# Patient Record
Sex: Female | Born: 1965
Health system: Southern US, Community
[De-identification: ages and names within clinical notes are randomized; demographics above are authoritative.]

## PROBLEM LIST (undated history)

## (undated) DIAGNOSIS — F329 Major depressive disorder, single episode, unspecified: Secondary | ICD-10-CM

## (undated) DIAGNOSIS — F32A Depression, unspecified: Secondary | ICD-10-CM

## (undated) HISTORY — DX: Major depressive disorder, single episode, unspecified: F32.9

## (undated) HISTORY — PX: NO PAST SURGERIES: SHX2092

## (undated) HISTORY — DX: Depression, unspecified: F32.A

---

## 2010-01-11 ENCOUNTER — Ambulatory Visit: Payer: Self-pay

## 2010-01-17 ENCOUNTER — Ambulatory Visit: Payer: Self-pay

## 2012-12-16 ENCOUNTER — Ambulatory Visit: Payer: Self-pay

## 2012-12-31 ENCOUNTER — Encounter: Payer: Self-pay | Admitting: Internal Medicine

## 2012-12-31 ENCOUNTER — Ambulatory Visit (INDEPENDENT_AMBULATORY_CARE_PROVIDER_SITE_OTHER): Payer: BC Managed Care – PPO | Admitting: Internal Medicine

## 2012-12-31 VITALS — BP 110/60 | HR 62 | Temp 97.6°F | Ht 62.0 in | Wt 143.0 lb

## 2012-12-31 DIAGNOSIS — F329 Major depressive disorder, single episode, unspecified: Secondary | ICD-10-CM

## 2012-12-31 DIAGNOSIS — F3289 Other specified depressive episodes: Secondary | ICD-10-CM

## 2012-12-31 DIAGNOSIS — F32A Depression, unspecified: Secondary | ICD-10-CM

## 2012-12-31 DIAGNOSIS — Z13220 Encounter for screening for lipoid disorders: Secondary | ICD-10-CM

## 2012-12-31 DIAGNOSIS — N939 Abnormal uterine and vaginal bleeding, unspecified: Secondary | ICD-10-CM

## 2012-12-31 DIAGNOSIS — Z780 Asymptomatic menopausal state: Secondary | ICD-10-CM

## 2012-12-31 DIAGNOSIS — N926 Irregular menstruation, unspecified: Secondary | ICD-10-CM

## 2012-12-31 MED ORDER — HYDROCORTISONE ACE-PRAMOXINE 1-1 % RE CREA: TOPICAL_CREAM | Freq: Two times a day (BID) | RECTAL | Status: DC

## 2012-12-31 NOTE — Patient Instructions (Signed)
I want you to start zantac (ranitidine) 150mg  - one per day.  Take 30 minutes before you eat breakfast.

## 2013-01-01 ENCOUNTER — Other Ambulatory Visit (INDEPENDENT_AMBULATORY_CARE_PROVIDER_SITE_OTHER): Payer: BC Managed Care – PPO

## 2013-01-01 DIAGNOSIS — Z13220 Encounter for screening for lipoid disorders: Secondary | ICD-10-CM

## 2013-01-01 DIAGNOSIS — N926 Irregular menstruation, unspecified: Secondary | ICD-10-CM

## 2013-01-01 LAB — CBC WITH DIFFERENTIAL/PLATELET
Basophils Absolute: 0 10*3/uL (ref 0.0–0.1)
Eosinophils Absolute: 0.1 10*3/uL (ref 0.0–0.7)
HCT: 40.8 % (ref 36.0–46.0)
Hemoglobin: 13.7 g/dL (ref 12.0–15.0)
Lymphocytes Relative: 39.9 % (ref 12.0–46.0)
Lymphs Abs: 2.2 10*3/uL (ref 0.7–4.0)
MCHC: 33.5 g/dL (ref 30.0–36.0)
Neutro Abs: 2.8 10*3/uL (ref 1.4–7.7)
Platelets: 260 10*3/uL (ref 150.0–400.0)
RDW: 13 % (ref 11.5–14.6)

## 2013-01-01 LAB — LIPID PANEL
HDL: 63.2 mg/dL (ref >39.00)
Total CHOL/HDL Ratio: 4
Triglycerides: 69 mg/dL (ref 0.0–149.0)
VLDL: 13.8 mg/dL (ref 0.0–40.0)

## 2013-01-01 LAB — COMPREHENSIVE METABOLIC PANEL
ALT: 15 U/L (ref 0–35)
AST: 16 U/L (ref 0–37)
Creatinine, Ser: 1 mg/dL (ref 0.4–1.2)
Sodium: 138 mEq/L (ref 135–145)
Total Bilirubin: 0.7 mg/dL (ref 0.3–1.2)

## 2013-01-03 ENCOUNTER — Encounter: Payer: Self-pay | Admitting: Internal Medicine

## 2013-01-03 DIAGNOSIS — Z780 Asymptomatic menopausal state: Secondary | ICD-10-CM | POA: Insufficient documentation

## 2013-01-03 DIAGNOSIS — F329 Major depressive disorder, single episode, unspecified: Secondary | ICD-10-CM | POA: Insufficient documentation

## 2013-01-03 NOTE — Assessment & Plan Note (Signed)
No period for years now.  She request [facility] to be checked.

## 2013-01-03 NOTE — Assessment & Plan Note (Signed)
On Zoloft and doing well.  Follow.  Discussed the possibility of a slow taper in the near future.

## 2013-01-03 NOTE — Progress Notes (Signed)
Subjective:    Patient ID: Alexandra Delgado, female    DOB: [birth year], 47 y.o.   MRN: [id number]  HPI 47 year old female with past history of depression who comes in today to follow up on this as well as to establish care.  She has previously been seeing Dr Vear Clock.  States her last pap smear was 10 years ago.  Has not had regular physicals.  She has a history of one abnormal pap smear.  Repeat ok.  She is up to date with her mammograms.  States last mammo was recent and ok.  She dose have a history of depression.  Was situational - when she was going through her divorce.  On Zoloft.  Has tried to come off previously - but made her feel sick on her stomach when she tried to stop.  Has been doing well on the medication.  She has not had a period in a few years.  Prior to that, had sporadic periods.  No bleeding or spotting for a few years.  No chest pain or tightness.  No sob.  She does occasionally feel as if things are getting "hung up" in her throat.  Question of some minimal acid reflux. Able to drink without difficulty.  Bowels stable.    Past Medical History  Diagnosis Date  . Depression     Outpatient Encounter Prescriptions as of 12/31/2012  Medication Sig Dispense Refill  . pramoxine-hydrocortisone (ANALPRAM-HC) 1-1 % rectal cream Place rectally 2 (two) times daily.  30 g  0  . sertraline (ZOLOFT) 100 MG tablet Take 100 mg by mouth daily.       No facility-administered encounter medications on file as of 12/31/2012.    Review of Systems Patient denies any headache, lightheadedness or dizziness.  No sinus or allergy symptoms.  No chest pain, tightness or palpitations.  No increased shortness of breath, cough or congestion.  Minimal acid reflux as outlined.  Minimal dysphagia.  No odynophagia.  No nausea or vomiting.  No abdominal pain or cramping.  No bowel change, such as diarrhea, constipation, BRBPR or melana.  No urine change.  No vaginal itching or irritation.  No period or spotting  for years.  She does have what she feels is a hemorrhoid - external.  First noticed 8 months ago.  Occasional bleeding.  Regular bowel movements.  No pain.  No change during these 8 months.  Feels she is handling stress well.  On zoloft and doing well.  No depression.      Objective:   Physical Exam Filed Vitals:   12/31/12 0947  BP: 110/60  Pulse: 62  Temp: 97.6 F (55.55 C)   47 year old female in no acute distress.   HEENT:  Nares- clear.  Oropharynx - without lesions. NECK:  Supple.  Nontender.  No audible bruit.  HEART:  Appears to be regular. LUNGS:  No crackles or wheezing audible.  Respirations even and unlabored.  RADIAL PULSE:  Equal bilaterally.    ABDOMEN:  Soft, nontender.  Bowel sounds present and normal.  No audible abdominal bruit.    RECTAL:  Heme negative.  External rectal tag.  No pain.  No increased erythema.    EXTREMITIES:  No increased edema present.  DP pulses palpable and equal bilaterally.      SKIN:  No rash.  NEURO:  No focal neurological deficits noted on exam.      Assessment & Plan:  QUESTION OF HEMORRHOID.  Exam as outlined.  Analpram as directed.  Follow.    FAMILY HISTORY OF BREAST CANCER.  States she is up to date with her mammograms.  Obtain results.   HISTORY OF ABNORMAL PAP.  Overdue a pap.  Will schedule to be done next visit.   HEALTH MAINTENANCE.  Overdue a physical.  Schedule a physical next visit.  Obtain results of mammogram.

## 2013-01-04 LAB — FOLLICLE STIMULATING HORMONE: FSH: 97 m[IU]/mL

## 2013-01-05 ENCOUNTER — Encounter: Payer: Self-pay | Admitting: General Practice

## 2013-01-05 NOTE — Progress Notes (Signed)
Letter mailed to pt.  

## 2013-02-15 ENCOUNTER — Other Ambulatory Visit (HOSPITAL_COMMUNITY)
Admission: RE | Admit: 2013-02-15 | Discharge: 2013-02-15 | Disposition: A | Discharge status: Home / Self Care | Payer: BC Managed Care – PPO | Source: Ambulatory Visit | Attending: Internal Medicine | Admitting: Internal Medicine

## 2013-02-15 ENCOUNTER — Ambulatory Visit (INDEPENDENT_AMBULATORY_CARE_PROVIDER_SITE_OTHER): Payer: BC Managed Care – PPO | Admitting: Internal Medicine

## 2013-02-15 ENCOUNTER — Encounter: Payer: Self-pay | Admitting: Internal Medicine

## 2013-02-15 VITALS — BP 120/70 | HR 73 | Temp 97.9°F | Ht 61.0 in | Wt 145.2 lb

## 2013-02-15 DIAGNOSIS — M549 Dorsalgia, unspecified: Secondary | ICD-10-CM

## 2013-02-15 DIAGNOSIS — Z780 Asymptomatic menopausal state: Secondary | ICD-10-CM

## 2013-02-15 DIAGNOSIS — Z124 Encounter for screening for malignant neoplasm of cervix: Secondary | ICD-10-CM

## 2013-02-15 DIAGNOSIS — Z01419 Encounter for gynecological examination (general) (routine) without abnormal findings: Secondary | ICD-10-CM | POA: Insufficient documentation

## 2013-02-15 DIAGNOSIS — F3289 Other specified depressive episodes: Secondary | ICD-10-CM

## 2013-02-15 DIAGNOSIS — Z1151 Encounter for screening for human papillomavirus (HPV): Secondary | ICD-10-CM | POA: Insufficient documentation

## 2013-02-15 DIAGNOSIS — F329 Major depressive disorder, single episode, unspecified: Secondary | ICD-10-CM

## 2013-02-15 DIAGNOSIS — F32A Depression, unspecified: Secondary | ICD-10-CM

## 2013-02-15 LAB — URINALYSIS, ROUTINE W REFLEX MICROSCOPIC
Hgb urine dipstick: NEGATIVE
Specific Gravity, Urine: >=1.03 (ref 1.000–1.030)
Urine Glucose: NEGATIVE
Urobilinogen, UA: 0.2 (ref 0.0–1.0)

## 2013-02-15 LAB — BASIC METABOLIC PANEL
Calcium: 9.4 mg/dL (ref 8.4–10.5)
GFR: 57.52 mL/min — ABNORMAL LOW (ref >60.00)
Potassium: 4.9 mEq/L (ref 3.5–5.1)
Sodium: 139 mEq/L (ref 135–145)

## 2013-02-16 ENCOUNTER — Ambulatory Visit: Payer: BC Managed Care – PPO

## 2013-02-16 DIAGNOSIS — M549 Dorsalgia, unspecified: Secondary | ICD-10-CM

## 2013-02-21 ENCOUNTER — Encounter: Payer: Self-pay | Admitting: Internal Medicine

## 2013-02-21 LAB — URINE CULTURE: Colony Count: 45000

## 2013-02-21 NOTE — Assessment & Plan Note (Signed)
On Zoloft and doing well.  Follow.  Discussed the possibility of a slow taper in the near future.   

## 2013-02-21 NOTE — Assessment & Plan Note (Signed)
No period for years now.  Follow.  Appears to be doing well.   

## 2013-02-21 NOTE — Progress Notes (Signed)
Subjective:    Patient ID: Alexandra Delgado, female    DOB: Mar 27, 1966, 46 y.o.   MRN: [id number]  HPI 47 year old female with past history of depression who comes in today to follow up on this as well as for a complete physical exam.  She has a history of one abnormal pap smear.  Repeat ok.  She is up to date with her mammograms.  States last mammo was recent and ok.  She dose have a history of depression.  Was situational - when she was going through her divorce.  On Zoloft.  Has tried to come off previously - but made her feel sick on her stomach when she tried to stop.  Has been doing well on the medication.  She has not had a period in a few years.  Prior to that, had sporadic periods.  No bleeding or spotting for a few years.  No chest pain or tightness.  No sob.  Bowels stable.  She does report some right lower back discomfort.  Began a few days ago - dull ache.  No known injury or trauma.  Tylenol helped.  Does not appear to worsen with certain movements.  No rash.  She is anxious about her kidneys.     Past Medical History  Diagnosis Date  . Depression     Outpatient Encounter Prescriptions as of 02/15/2013  Medication Sig Dispense Refill  . sertraline (ZOLOFT) 100 MG tablet Take 100 mg by mouth daily.      . pramoxine-hydrocortisone (ANALPRAM-HC) 1-1 % rectal cream Place rectally 2 (two) times daily.  30 g  0   No facility-administered encounter medications on file as of 02/15/2013.    Review of Systems Patient denies any headache, lightheadedness or dizziness.  No sinus or allergy symptoms.  No chest pain, tightness or palpitations.  No increased shortness of breath, cough or congestion.  No acid reflux reported today.  No nausea or vomiting.  No abdominal pain or cramping.  No bowel change, such as diarrhea, constipation, BRBPR or melana.  No urine change.  No vaginal itching or irritation.  No period or spotting for years.  Feels she is handling stress well.  On zoloft and doing well.   No depression.  Some back discomfort as outlined.       Objective:   Physical Exam  Filed Vitals:   02/15/13 1127  BP: 120/70  Pulse: 73  Temp: 97.9 F (2.67 C)   47 year old female in no acute distress.   HEENT:  Nares- clear.  Oropharynx - without lesions. NECK:  Supple.  Nontender.  No audible bruit.  HEART:  Appears to be regular. LUNGS:  No crackles or wheezing audible.  Respirations even and unlabored.  RADIAL PULSE:  Equal bilaterally.    BREASTS:  No nipple discharge or nipple retraction present.  Could not appreciate any distinct nodules or axillary adenopathy.  ABDOMEN:  Soft, nontender.  Bowel sounds present and normal.  No audible abdominal bruit.  GU:  Normal external genitalia.  Vaginal vault without lesions.  Cervix identified.  Pap performed. Could not appreciate any adnexal masses or tenderness.   RECTAL:  Heme negative.   BACK:  Non tender to palpation.  No CVA tenderness.  EXTREMITIES:  No increased edema present.  DP pulses palpable and equal bilaterally.          Assessment & Plan:  FAMILY HISTORY OF BREAST CANCER.  States she is up to date with her mammograms.  Need results.   BACK PAIN.  Tylenol appears to help.  Tylenol prn.  Check urine and renal function to confirm no change.  Follow.  Notify me if symptoms persist or worsen.    HISTORY OF ABNORMAL PAP.  Pelvic and pap today.    HEALTH MAINTENANCE.  Physical today.  Pap today.  Need results of mammogram.

## 2013-02-22 ENCOUNTER — Other Ambulatory Visit: Payer: Self-pay | Admitting: Internal Medicine

## 2013-02-22 DIAGNOSIS — N390 Urinary tract infection, site not specified: Secondary | ICD-10-CM

## 2013-02-22 NOTE — Progress Notes (Signed)
Order placed for f/u labs.  

## 2013-02-23 ENCOUNTER — Telehealth: Payer: Self-pay | Admitting: *Deleted

## 2013-02-23 DIAGNOSIS — N390 Urinary tract infection, site not specified: Secondary | ICD-10-CM

## 2013-02-23 MED ORDER — CIPROFLOXACIN HCL 500 MG PO TABS: 500.0000 mg | ORAL_TABLET | Freq: Two times a day (BID) | ORAL | Status: DC

## 2013-02-23 NOTE — Telephone Encounter (Signed)
Message copied by Theola Sequin on Tue Feb 23, 2013  8:50 AM ------      Message from: Charm Barges      Created: Mon Feb 22, 2013  3:34 PM       I have tried to call pt multiple times regarding her lab.  Unable to reach.  Did not leave message over weekend - secondary to her not being able to call back.  Please call and notify her that her urine culture was positive for urinary tract infection.  Confirm that she is not allergic to cipro.  If not, then treat with cipro 500mg  bid x 5 days.  Will need to recheck a urinalysis 2 weeks after she completes her abx.  Also her kidney function is relatively stable.  I do want her to stay hydrated.  Will treat the urinary infection and will plan to repeat her kidney function in 2 weeks - with the repeat urinalysis.  Please schedule her for a non fasting lab appt in 2 -3 weeks.  Also, let her know that her pap smear - ok. ------

## 2013-03-10 ENCOUNTER — Other Ambulatory Visit: Payer: BC Managed Care – PPO

## 2013-03-11 ENCOUNTER — Other Ambulatory Visit: Payer: BC Managed Care – PPO

## 2013-03-11 ENCOUNTER — Emergency Department: Payer: Self-pay | Admitting: Emergency Medicine

## 2013-03-11 LAB — CBC WITH DIFFERENTIAL/PLATELET
Basophil %: 0.8 %
HCT: 42.2 % (ref 35.0–47.0)
Lymphocyte #: 3.9 10*3/uL — ABNORMAL HIGH (ref 1.0–3.6)
Lymphocyte %: 52.2 %
MCH: 29.2 pg (ref 26.0–34.0)
MCHC: 34.1 g/dL (ref 32.0–36.0)
MCV: 86 fL (ref 80–100)
Monocyte #: 0.5 x10 3/mm (ref 0.2–0.9)
Neutrophil #: 2.9 10*3/uL (ref 1.4–6.5)
Platelet: 286 10*3/uL (ref 150–440)
RDW: 12.6 % (ref 11.5–14.5)
WBC: 7.5 10*3/uL (ref 3.6–11.0)

## 2013-03-11 LAB — BASIC METABOLIC PANEL
Anion Gap: 9 (ref 7–16)
BUN: 13 mg/dL (ref 7–18)
Calcium, Total: 9.6 mg/dL (ref 8.5–10.1)
Chloride: 103 mmol/L (ref 98–107)
Co2: 25 mmol/L (ref 21–32)
Creatinine: 0.98 mg/dL (ref 0.60–1.30)
EGFR (African American): >60
EGFR (Non-African Amer.): >60
Osmolality: 276 (ref 275–301)
Potassium: 3.6 mmol/L (ref 3.5–5.1)
Sodium: 137 mmol/L (ref 136–145)

## 2013-03-11 LAB — URINALYSIS, COMPLETE
Bilirubin,UR: NEGATIVE
Glucose,UR: NEGATIVE mg/dL (ref 0–75)
Ketone: NEGATIVE
Leukocyte Esterase: NEGATIVE
Protein: NEGATIVE
RBC,UR: 1 /HPF (ref 0–5)
WBC UR: 2 /HPF (ref 0–5)

## 2013-04-01 ENCOUNTER — Other Ambulatory Visit: Payer: Self-pay | Admitting: Internal Medicine

## 2013-05-06 ENCOUNTER — Encounter: Payer: Self-pay | Admitting: Internal Medicine

## 2013-07-08 ENCOUNTER — Telehealth: Payer: Self-pay | Admitting: Internal Medicine

## 2013-07-08 MED ORDER — SERTRALINE HCL 100 MG PO TABS: ORAL_TABLET | ORAL | Status: DC

## 2013-07-08 NOTE — Telephone Encounter (Signed)
Rx sent 

## 2013-07-08 NOTE — Telephone Encounter (Signed)
Sertraline refill needed.  [facility] [address].

## 2013-07-14 ENCOUNTER — Ambulatory Visit (INDEPENDENT_AMBULATORY_CARE_PROVIDER_SITE_OTHER): Payer: BC Managed Care – PPO

## 2013-07-14 DIAGNOSIS — Z23 Encounter for immunization: Secondary | ICD-10-CM

## 2013-08-20 ENCOUNTER — Encounter: Payer: Self-pay | Admitting: *Deleted

## 2013-08-23 ENCOUNTER — Ambulatory Visit: Payer: BC Managed Care – PPO | Admitting: Internal Medicine

## 2013-10-12 ENCOUNTER — Other Ambulatory Visit: Payer: Self-pay | Admitting: Internal Medicine

## 2013-10-12 NOTE — Telephone Encounter (Signed)
Appt 10/22/13

## 2013-10-22 ENCOUNTER — Encounter: Payer: Self-pay | Admitting: *Deleted

## 2013-10-22 ENCOUNTER — Encounter: Payer: Self-pay | Admitting: Internal Medicine

## 2013-10-22 ENCOUNTER — Ambulatory Visit (INDEPENDENT_AMBULATORY_CARE_PROVIDER_SITE_OTHER): Payer: BC Managed Care – PPO | Admitting: Internal Medicine

## 2013-10-22 VITALS — BP 110/80 | HR 77 | Temp 98.1°F | Ht 61.0 in | Wt 127.5 lb

## 2013-10-22 DIAGNOSIS — R634 Abnormal weight loss: Secondary | ICD-10-CM

## 2013-10-22 DIAGNOSIS — N951 Menopausal and female climacteric states: Secondary | ICD-10-CM

## 2013-10-22 DIAGNOSIS — F32A Depression, unspecified: Secondary | ICD-10-CM

## 2013-10-22 DIAGNOSIS — Z780 Asymptomatic menopausal state: Secondary | ICD-10-CM

## 2013-10-22 DIAGNOSIS — F3289 Other specified depressive episodes: Secondary | ICD-10-CM

## 2013-10-22 DIAGNOSIS — F329 Major depressive disorder, single episode, unspecified: Secondary | ICD-10-CM

## 2013-10-22 MED ORDER — SERTRALINE HCL 100 MG PO TABS: 100.0000 mg | ORAL_TABLET | Freq: Every day | ORAL | Status: DC

## 2013-10-22 NOTE — Progress Notes (Signed)
  Subjective:    Patient ID: Alexandra Delgado, female    DOB: 08/31/1966, 48 y.o.   MRN: [id number]  HPI 48 year old female with past history of depression who comes in today for a scheduled follow up.   She has a history of one abnormal pap smear.  Repeat ok.  She is up to date with her mammograms.  She dose have a history of depression.  Was situational - when she was going through her divorce.  On Zoloft.  Has tried to come off previously - but made her feel sick on her stomach when she tried to stop.  Has been doing well on the medication.  She has not had a period in a few years.  Prior to that, had sporadic periods.  No bleeding or spotting for a few years.  No chest pain or tightness.  No sob.  Bowels stable.  She has been going to a weight loss center.  Seeing Alexandra Delgado there.  Has lost 20 pounds.  Drinking no caffeine.  Has adjusted her diet.  Receiving b12 injections.  Also taking phentermine.  Overall feels better.  Exercising.     Past Medical History  Diagnosis Date  . Depression     Outpatient Encounter Prescriptions as of 10/22/2013  Medication Sig  . phentermine 37.5 MG capsule Take 37.5 mg by mouth every morning.  . sertraline (ZOLOFT) 100 MG tablet TAKE 1 TABLET EVERY MORNING  . [DISCONTINUED] ciprofloxacin (CIPRO) 500 MG tablet Take 1 tablet (500 mg total) by mouth 2 (two) times daily.  . [DISCONTINUED] pramoxine-hydrocortisone (ANALPRAM-HC) 1-1 % rectal cream Place rectally 2 (two) times daily.    Review of Systems Patient denies any headache, lightheadedness or dizziness.  No sinus or allergy symptoms.  No chest pain, tightness or palpitations.  No increased shortness of breath, cough or congestion.  No acid reflux reported today.  No nausea or vomiting.  No abdominal pain or cramping.  No bowel change, such as diarrhea, constipation, BRBPR or melana.  No urine change.  No vaginal itching or irritation.  No period or spotting for years.  Feels she is handling stress well.   On zoloft and doing well.  No depression.  Weight loss as outlined.  Exercising.  Has adjusted her diet.        Objective:   Physical Exam  Filed Vitals:   10/22/13 1614  BP: 110/80  Pulse: 77  Temp: 98.1 F (3436.197 C)   48 year old female in no acute distress.   HEENT:  Nares- clear.  Oropharynx - without lesions. NECK:  Supple.  Nontender.  No audible bruit.  HEART:  Appears to be regular. LUNGS:  No crackles or wheezing audible.  Respirations even and unlabored.  RADIAL PULSE:  Equal bilaterally.   ABDOMEN:  Soft, nontender.  Bowel sounds present and normal.  No audible abdominal bruit. EXTREMITIES:  No increased edema present.  DP pulses palpable and equal bilaterally.          Assessment & Plan:  FAMILY HISTORY OF BREAST CANCER.  States she is up to date with her mammograms.  Need results.   BACK PAIN.  Not an issue today.      HISTORY OF ABNORMAL PAP.  Pap 02/15/13 negative with negative HPV.     HEALTH MAINTENANCE.  Physical 02/15/13.   Pap as outlined.   Need results of mammogram.

## 2013-10-22 NOTE — Progress Notes (Signed)
Pre-visit discussion using our clinic review tool. No additional management support is needed unless otherwise documented below in the visit note.  

## 2013-10-25 ENCOUNTER — Encounter: Payer: Self-pay | Admitting: Internal Medicine

## 2013-10-25 DIAGNOSIS — R634 Abnormal weight loss: Secondary | ICD-10-CM | POA: Insufficient documentation

## 2013-10-25 NOTE — Assessment & Plan Note (Signed)
On Zoloft and doing well.  Follow.   

## 2013-10-25 NOTE — Assessment & Plan Note (Signed)
Has lost weight.  Feels better.  Exercising.  Has adjusted her diet.  Seeing Dr Feliberto GottronSchermerhorn.

## 2013-10-25 NOTE — Assessment & Plan Note (Signed)
No period for years now.  Follow.  Appears to be doing well.   

## 2013-11-14 ENCOUNTER — Other Ambulatory Visit: Payer: Self-pay | Admitting: Internal Medicine

## 2013-11-15 ENCOUNTER — Encounter: Payer: Self-pay | Admitting: Internal Medicine

## 2014-03-14 ENCOUNTER — Other Ambulatory Visit: Payer: Self-pay | Admitting: Internal Medicine

## 2014-03-25 ENCOUNTER — Encounter: Payer: Self-pay | Admitting: Internal Medicine

## 2014-03-25 ENCOUNTER — Ambulatory Visit (INDEPENDENT_AMBULATORY_CARE_PROVIDER_SITE_OTHER): Payer: BC Managed Care – PPO | Admitting: Internal Medicine

## 2014-03-25 VITALS — BP 126/70 | HR 70 | Temp 98.2°F | Ht 60.85 in | Wt 124.8 lb

## 2014-03-25 DIAGNOSIS — Z1239 Encounter for other screening for malignant neoplasm of breast: Secondary | ICD-10-CM

## 2014-03-25 DIAGNOSIS — E7800 Pure hypercholesterolemia, unspecified: Secondary | ICD-10-CM

## 2014-03-25 DIAGNOSIS — R634 Abnormal weight loss: Secondary | ICD-10-CM

## 2014-03-25 DIAGNOSIS — F329 Major depressive disorder, single episode, unspecified: Secondary | ICD-10-CM

## 2014-03-25 DIAGNOSIS — F32A Depression, unspecified: Secondary | ICD-10-CM

## 2014-03-25 DIAGNOSIS — Z780 Asymptomatic menopausal state: Secondary | ICD-10-CM

## 2014-03-25 DIAGNOSIS — F3289 Other specified depressive episodes: Secondary | ICD-10-CM

## 2014-03-25 DIAGNOSIS — N951 Menopausal and female climacteric states: Secondary | ICD-10-CM

## 2014-03-25 MED ORDER — SERTRALINE HCL 100 MG PO TABS: 100.0000 mg | ORAL_TABLET | Freq: Every morning | ORAL | Status: DC

## 2014-03-25 NOTE — Progress Notes (Signed)
Pre visit review using our clinic review tool, if applicable. No additional management support is needed unless otherwise documented below in the visit note. 

## 2014-03-26 ENCOUNTER — Encounter: Payer: Self-pay | Admitting: Internal Medicine

## 2014-03-26 NOTE — Assessment & Plan Note (Signed)
No period for years now.  Follow.  Appears to be doing well.

## 2014-03-26 NOTE — Assessment & Plan Note (Signed)
On Zoloft and doing well.  Follow.   

## 2014-03-26 NOTE — Assessment & Plan Note (Addendum)
Off phentermine.  Doing well.  Follow.

## 2014-03-26 NOTE — Progress Notes (Signed)
Subjective:    Patient ID: Alexandra Delgado, female    DOB: 07/13/1966, 48 y.o.   MRN: [id number]  HPI 48 year old female with past history of depression who comes in today to follow up on these issues as well as for a complete physical exam.  She has a history of one abnormal pap smear.  Repeat ok.  She is up to date with her mammograms.  She dose have a history of depression.  Was situational - when she was going through her divorce.  On Zoloft.  Has tried to come off previously - but made her feel sick on her stomach when she tried to stop.  Has been doing well on the medication.  She has not had a period in a few years.  Prior to that, had sporadic periods.  No bleeding or spotting for a few years.  No chest pain or tightness.  No sob.  Bowels stable.  Has adjusted her diet.  Has lost weight.  No longer seeing Dr Feliberto GottronSchermerhorn.   Not receiving b12 injections nor taking phentermine.  Overall feels better.  Exercising.     Past Medical History  Diagnosis Date  . Depression     Outpatient Encounter Prescriptions as of 03/25/2014  Medication Sig  . sertraline (ZOLOFT) 100 MG tablet Take 1 tablet (100 mg total) by mouth every morning. Must keep appt on 6/26 for additional refills  . [DISCONTINUED] phentermine 37.5 MG capsule Take 37.5 mg by mouth every morning.  . [DISCONTINUED] sertraline (ZOLOFT) 100 MG tablet Take 1 tablet (100 mg total) by mouth daily.  . [DISCONTINUED] sertraline (ZOLOFT) 100 MG tablet Take 1 tablet (100 mg total) by mouth every morning. Must keep appt on 6/26 for additional refills    Review of Systems Patient denies any headache, lightheadedness or dizziness.  No sinus or allergy symptoms.  No chest pain, tightness or palpitations.  No increased shortness of breath, cough or congestion.  No acid reflux.  No nausea or vomiting.  No abdominal pain or cramping.  No bowel change, such as diarrhea, constipation, BRBPR or melana.  No urine change.  No vaginal itching or  irritation.  No period or spotting for years.  Feels she is handling stress well.  On zoloft and doing well.  No depression.  Weight loss as outlined.  Exercising.  Has adjusted her diet.        Objective:   Physical Exam  Filed Vitals:   03/25/14 1541  BP: 126/70  Pulse: 70  Temp: 98.2 F (6636.488 C)   48 year old female in no acute distress.   HEENT:  Nares- clear.  Oropharynx - without lesions. NECK:  Supple.  Nontender.  No audible bruit.  HEART:  Appears to be regular. LUNGS:  No crackles or wheezing audible.  Respirations even and unlabored.  RADIAL PULSE:  Equal bilaterally.    BREASTS:  No nipple discharge or nipple retraction present.  Could not appreciate any distinct nodules or axillary adenopathy.  ABDOMEN:  Soft, nontender.  Bowel sounds present and normal.  No audible abdominal bruit.  GU:  Not performed.     EXTREMITIES:  No increased edema present.  DP pulses palpable and equal bilaterally.          Assessment & Plan:  FAMILY HISTORY OF BREAST CANCER.  Need to schedule mammogram.    BACK PAIN.  Not an issue today.      HISTORY OF ABNORMAL PAP.  Pap 02/15/13 negative with negative  HPV.     HEALTH MAINTENANCE.  Physical today.   Pap as outlined.   Schedule mammogram.    I spent 25 minutes with the patient and more than 50% of the time was spent in consultation regarding the above.

## 2014-04-14 ENCOUNTER — Other Ambulatory Visit: Payer: Self-pay | Admitting: Internal Medicine

## 2014-04-14 NOTE — Telephone Encounter (Signed)
Last refill and OV 6.26.15, next OV 1.8.16.  Please advise refill.

## 2014-04-14 NOTE — Telephone Encounter (Signed)
Refilled zoloft 100mg  q day #30 with 5 refills.

## 2014-04-15 ENCOUNTER — Other Ambulatory Visit: Payer: BC Managed Care – PPO

## 2014-06-17 ENCOUNTER — Ambulatory Visit (INDEPENDENT_AMBULATORY_CARE_PROVIDER_SITE_OTHER): Payer: BC Managed Care – PPO | Admitting: Family Medicine

## 2014-06-17 ENCOUNTER — Encounter: Payer: Self-pay | Admitting: Family Medicine

## 2014-06-17 VITALS — BP 92/56 | HR 62 | Temp 98.1°F | Ht 60.85 in | Wt 124.5 lb

## 2014-06-17 DIAGNOSIS — B029 Zoster without complications: Secondary | ICD-10-CM

## 2014-06-17 HISTORY — DX: Zoster without complications: B02.9

## 2014-06-17 MED ORDER — VALACYCLOVIR HCL 1 G PO TABS: 1000.0000 mg | ORAL_TABLET | Freq: Three times a day (TID) | ORAL | Status: DC

## 2014-06-17 MED ORDER — TRAMADOL HCL 50 MG PO TABS: 50.0000 mg | ORAL_TABLET | Freq: Three times a day (TID) | ORAL | Status: DC | PRN

## 2014-06-17 NOTE — Assessment & Plan Note (Signed)
Antiviral, pain control. Info provinede and reviewed orally in detail with pt.

## 2014-06-17 NOTE — Progress Notes (Signed)
   Subjective:    Patient ID: Alexandra Delgado, female    DOB: [birth year], 48 y.o.   MRN: [id number]  Back Pain Pertinent negatives include no abdominal pain, dysuria or fever.  Rash Pertinent negatives include no eye pain, fatigue, fever or shortness of breath.   48 year old female pt of Dr. Lorin Picket  presents with dull ache in low back.  2 days ago she noted a rash on her right torso. Rash is red background with blisters, no itching. No fever, no N/V. She had slight diarrhea few days prior to back pain starting.  She has had chicken pox as a child.   She has tried motrin, bayer aspirin, icy hot and heating pad. Helped a little. She has never used other        Review of Systems  Constitutional: Negative for fever and fatigue.  HENT: Negative for ear pain.   Eyes: Negative for pain.  Respiratory: Negative for chest tightness and shortness of breath.   Cardiovascular: Negative for palpitations and leg swelling.  Gastrointestinal: Negative for abdominal pain.  Genitourinary: Negative for dysuria.  Musculoskeletal: Positive for back pain.  Skin: Positive for rash.       Objective:   Physical Exam  Constitutional: Vital signs are normal. She appears well-developed and well-nourished. She is cooperative.  Non-toxic appearance. She does not appear ill. No distress.  HENT:  Head: Normocephalic.  Right Ear: Hearing, tympanic membrane, external ear and ear canal normal. Tympanic membrane is not erythematous, not retracted and not bulging.  Left Ear: Hearing, tympanic membrane, external ear and ear canal normal. Tympanic membrane is not erythematous, not retracted and not bulging.  Nose: No mucosal edema or rhinorrhea. Right sinus exhibits no maxillary sinus tenderness and no frontal sinus tenderness. Left sinus exhibits no maxillary sinus tenderness and no frontal sinus tenderness.  Mouth/Throat: Uvula is midline, oropharynx is clear and moist and mucous membranes are normal.  Eyes:  Conjunctivae, EOM and lids are normal. Pupils are equal, round, and reactive to light. Lids are everted and swept, no foreign bodies found.  Neck: Trachea normal and normal range of motion. Neck supple. Carotid bruit is not present. No mass and no thyromegaly present.  Cardiovascular: Normal rate, regular rhythm, S1 normal, S2 normal, normal heart sounds, intact distal pulses and normal pulses.  Exam reveals no gallop and no friction rub.   No murmur heard. Pulmonary/Chest: Effort normal and breath sounds normal. Not tachypneic. No respiratory distress. She has no decreased breath sounds. She has no wheezes. She has no rhonchi. She has no rales.  Abdominal: Soft. Normal appearance and bowel sounds are normal. There is no tenderness.  Musculoskeletal:       Cervical back: Normal. She exhibits no tenderness.       Thoracic back: She exhibits tenderness. She exhibits normal range of motion and no bony tenderness.  Neurological: She is alert.  Skin: Skin is warm, dry and intact. No rash noted.     Unilateral blistering rash on right chest associated with pain Pain at back is in same dermatome  Psychiatric: Her speech is normal and behavior is normal. Judgment and thought content normal. Her mood appears not anxious. Cognition and memory are normal. She does not exhibit a depressed mood.          Assessment & Plan:

## 2014-06-17 NOTE — Patient Instructions (Signed)
Start antiviral, valacyclovir. Control pain as best as you can. Start ibuprofen 800 mg every 8 hours for pain. For breakthrough pain use tramadol.  Shingles Shingles (herpes zoster) is an infection that is caused by the same virus that causes chickenpox (varicella). The infection causes a painful skin rash and fluid-filled blisters, which eventually break open, crust over, and heal. It may occur in any area of the body, but it usually affects only one side of the body or face. The pain of shingles usually lasts about 1 month. However, some people with shingles may develop long-term (chronic) pain in the affected area of the body. Shingles often occurs many years after the person had chickenpox. It is more common:  In people older than 50 years.  In people with weakened immune systems, such as those with HIV, AIDS, or cancer.  In people taking medicines that weaken the immune system, such as transplant medicines.  In people under great stress. CAUSES  Shingles is caused by the varicella zoster virus (VZV), which also causes chickenpox. After a person is infected with the virus, it can remain in the person's body for years in an inactive state (dormant). To cause shingles, the virus reactivates and breaks out as an infection in a nerve root. The virus can be spread from person to person (contagious) through contact with open blisters of the shingles rash. It will only spread to people who have not had chickenpox. When these people are exposed to the virus, they may develop chickenpox. They will not develop shingles. Once the blisters scab over, the person is no longer contagious and cannot spread the virus to others. SIGNS AND SYMPTOMS  Shingles shows up in stages. The initial symptoms may be pain, itching, and tingling in an area of the skin. This pain is usually described as burning, stabbing, or throbbing.In a few days or weeks, a painful red rash will appear in the area where the pain,  itching, and tingling were felt. The rash is usually on one side of the body in a band or belt-like pattern. Then, the rash usually turns into fluid-filled blisters. They will scab over and dry up in approximately 2-3 weeks. Flu-like symptoms may also occur with the initial symptoms, the rash, or the blisters. These may include:  Fever.  Chills.  Headache.  Upset stomach. DIAGNOSIS  Your health care provider will perform a skin exam to diagnose shingles. Skin scrapings or fluid samples may also be taken from the blisters. This sample will be examined under a microscope or sent to a lab for further testing. TREATMENT  There is no specific cure for shingles. Your health care provider will likely prescribe medicines to help you manage the pain, recover faster, and avoid long-term problems. This may include antiviral drugs, anti-inflammatory drugs, and pain medicines. HOME CARE INSTRUCTIONS   Take a cool bath or apply cool compresses to the area of the rash or blisters as directed. This may help with the pain and itching.   Take medicines only as directed by your health care provider.   Rest as directed by your health care provider.  Keep your rash and blisters clean with mild soap and cool water or as directed by your health care provider.  Do not pick your blisters or scratch your rash. Apply an anti-itch cream or numbing creams to the affected area as directed by your health care provider.  Keep your shingles rash covered with a loose bandage (dressing).  Avoid skin contact with:  Babies.   Pregnant women.   Children with eczema.   Elderly people with transplants.   People with chronic illnesses, such as leukemia or AIDS.   Wear loose-fitting clothing to help ease the pain of material rubbing against the rash.  Keep all follow-up visits as directed by your health care provider.If the area involved is on your face, you may receive a referral for a specialist, such as  an eye doctor (ophthalmologist) or an ear, nose, and throat (ENT) doctor. Keeping all follow-up visits will help you avoid eye problems, chronic pain, or disability.  SEEK IMMEDIATE MEDICAL CARE IF:   You have facial pain, pain around the eye area, or loss of feeling on one side of your face.  You have ear pain or ringing in your ear.  You have loss of taste.  Your pain is not relieved with prescribed medicines.   Your redness or swelling spreads.   You have more pain and swelling.  Your condition is worsening or has changed.   You have a fever. MAKE SURE YOU:  Understand these instructions.  Will watch your condition.  Will get help right away if you are not doing well or get worse. Document Released: 09/16/2005 Document Revised: 01/31/2014 Document Reviewed: 04/30/2012 [facility] Patient Information 2015 [place], [place]. This information is not intended to replace advice given to you by your health care provider. Make sure you discuss any questions you have with your health care provider.

## 2014-06-17 NOTE — Progress Notes (Signed)
Pre visit review using our clinic review tool, if applicable. No additional management support is needed unless otherwise documented below in the visit note. 

## 2014-06-20 ENCOUNTER — Telehealth: Payer: Self-pay | Admitting: *Deleted

## 2014-06-20 NOTE — Telephone Encounter (Signed)
Pt was seen at [facility] Friday & dx'd with Shingles. On Tramadol & Valcyclovir. Tramadol helping but not taking away all of the pain. Wants to know if this is normal or does she need something else for pain. Please advise

## 2014-06-21 ENCOUNTER — Telehealth: Payer: Self-pay | Admitting: Internal Medicine

## 2014-06-21 NOTE — Telephone Encounter (Signed)
See if she can come in 1:15 on 06/23/14.  Work in for this.

## 2014-06-21 NOTE — Telephone Encounter (Signed)
The patient called this am and is hoping to get something besides tramadol called in for her shingles pain.   Callback - [phone]

## 2014-06-21 NOTE — Telephone Encounter (Signed)
appt scheduled pt aware  

## 2014-06-21 NOTE — Telephone Encounter (Signed)
Pt called in with question regarding shingles.  Answered in my chart.

## 2014-06-21 NOTE — Telephone Encounter (Signed)
I called patient & made her aware of her unread mychart send this morning prior to her phone call. Pt states that if you are not willing to just call something different in, she needs to be seen. Please advise on date & time.

## 2014-06-21 NOTE — Telephone Encounter (Signed)
See my chart message to pt for response.

## 2014-06-23 ENCOUNTER — Encounter: Payer: Self-pay | Admitting: Internal Medicine

## 2014-06-23 ENCOUNTER — Ambulatory Visit (INDEPENDENT_AMBULATORY_CARE_PROVIDER_SITE_OTHER): Payer: BC Managed Care – PPO | Admitting: Internal Medicine

## 2014-06-23 VITALS — BP 110/80 | HR 63 | Temp 98.2°F | Ht 60.85 in | Wt 125.0 lb

## 2014-06-23 DIAGNOSIS — B029 Zoster without complications: Secondary | ICD-10-CM

## 2014-06-23 MED ORDER — GABAPENTIN 100 MG PO CAPS: ORAL_CAPSULE | ORAL | Status: DC

## 2014-06-23 NOTE — Progress Notes (Signed)
Pre visit review using our clinic review tool, if applicable. No additional management support is needed unless otherwise documented below in the visit note. 

## 2014-06-23 NOTE — Patient Instructions (Signed)
Continue ibuprofen as you are doing.    Tylenol - 1-2 tablets 1-2x/day.

## 2014-06-24 ENCOUNTER — Encounter: Payer: Self-pay | Admitting: Internal Medicine

## 2014-06-24 NOTE — Progress Notes (Signed)
  Subjective:    Patient ID: Alexandra Delgado, female    DOB: 1965-10-19, 48 y.o.   MRN: [id number]  HPI 48 year old female with past history of depression who comes in today as a work in with concerns regarding persistent pain from shingles.  She saw Dr Ermalene Searing on 06/17/14 after noticing increased pain beneath her right breast.  Rash present.  Diagnosed with shingles.  Given valtrex.  Taking.  Also given tramadol for pain.  She is taking the tramadol and ibuprofen for pain.  Is helping.  Feels she needs something more.  No fever.  States otherwise she is doing well.     Past Medical History  Diagnosis Date  . Depression     Outpatient Encounter Prescriptions as of 06/23/2014  Medication Sig  . ibuprofen (ADVIL,MOTRIN) 200 MG tablet Take 400 mg by mouth 2 (two) times daily.  . sertraline (ZOLOFT) 100 MG tablet Take 1 tablet (100 mg total) by mouth daily.  . valACYclovir (VALTREX) 1000 MG tablet Take 1 tablet (1,000 mg total) by mouth 3 (three) times daily.  . [DISCONTINUED] traMADol (ULTRAM) 50 MG tablet Take 1 tablet (50 mg total) by mouth every 8 (eight) hours as needed.  . gabapentin (NEURONTIN) 100 MG capsule One capsule in the am and one midday and two q hs    Review of Systems No fever.   No increased shortness of breath.   No nausea or vomiting.  No abdominal pain or cramping.  No bowel change.  Persistent pain beneath her right breast and extending around to her back.  Lesions starting to dry.       Objective:   Physical Exam  Filed Vitals:   06/23/14 1322  BP: 110/80  Pulse: 63  Temp: 98.2 F (32.47 C)   48 year old female in no acute distress.  NECK:  Supple.  Nontender.   SKIN:  Erythematous based rash.  Vesicles - drying. Extends beneath the right breast laterally.  No increased lesions on her back.         Assessment & Plan:  I spent 15 minutes with the patient and more than 50% of the time was spent in consultation regarding the above.

## 2014-06-24 NOTE — Assessment & Plan Note (Signed)
Complete valtrex.  Ibuprofen helps.  Will continue ibuprofen and start tylenol atc as directed.  Will stop ultram given on zoloft.  Will start neurontin with instructions on titration of the dose.  She will call over the next several days with update and will continue titration as she tolerates.  Discussed shingles diagnosis and questions answered.

## 2014-07-27 ENCOUNTER — Encounter: Payer: Self-pay | Admitting: Internal Medicine

## 2014-07-29 ENCOUNTER — Ambulatory Visit: Payer: Self-pay | Admitting: Internal Medicine

## 2014-07-29 LAB — HM MAMMOGRAPHY: HM Mammogram: NEGATIVE

## 2014-08-01 ENCOUNTER — Encounter: Payer: Self-pay | Admitting: *Deleted

## 2014-08-03 ENCOUNTER — Ambulatory Visit: Payer: BC Managed Care – PPO | Admitting: *Deleted

## 2014-08-03 ENCOUNTER — Ambulatory Visit (INDEPENDENT_AMBULATORY_CARE_PROVIDER_SITE_OTHER): Payer: BC Managed Care – PPO | Admitting: *Deleted

## 2014-08-03 DIAGNOSIS — Z23 Encounter for immunization: Secondary | ICD-10-CM

## 2014-08-12 ENCOUNTER — Encounter: Payer: Self-pay | Admitting: Internal Medicine

## 2014-10-07 ENCOUNTER — Ambulatory Visit: Payer: BC Managed Care – PPO | Admitting: Internal Medicine

## 2014-10-21 ENCOUNTER — Ambulatory Visit: Payer: Self-pay | Admitting: Internal Medicine

## 2014-10-28 ENCOUNTER — Other Ambulatory Visit: Payer: Self-pay | Admitting: Internal Medicine

## 2014-10-28 NOTE — Telephone Encounter (Signed)
Last OV 9.24.15, last refill 1.3.16.  Please advise refill

## 2014-10-28 NOTE — Telephone Encounter (Signed)
Refilled zoloft #30 with 2 refills.   

## 2014-10-31 ENCOUNTER — Other Ambulatory Visit: Payer: Self-pay | Admitting: Internal Medicine

## 2014-10-31 NOTE — Telephone Encounter (Signed)
Last OV 9.24.15 w/2 cancellations.  Last refill 1.3.16.  Please advise refill

## 2014-10-31 NOTE — Telephone Encounter (Signed)
This was just refilled on 10/28/14 - did that get to pharmacy.

## 2014-12-09 ENCOUNTER — Ambulatory Visit (INDEPENDENT_AMBULATORY_CARE_PROVIDER_SITE_OTHER): Payer: BLUE CROSS/BLUE SHIELD | Admitting: Internal Medicine

## 2014-12-09 ENCOUNTER — Encounter: Payer: Self-pay | Admitting: Internal Medicine

## 2014-12-09 VITALS — BP 110/70 | HR 68 | Temp 97.7°F | Ht 60.85 in | Wt 139.1 lb

## 2014-12-09 DIAGNOSIS — F329 Major depressive disorder, single episode, unspecified: Secondary | ICD-10-CM

## 2014-12-09 DIAGNOSIS — Z0000 Encounter for general adult medical examination without abnormal findings: Secondary | ICD-10-CM

## 2014-12-09 DIAGNOSIS — R635 Abnormal weight gain: Secondary | ICD-10-CM

## 2014-12-09 DIAGNOSIS — F32A Depression, unspecified: Secondary | ICD-10-CM

## 2014-12-09 NOTE — Progress Notes (Signed)
Pre visit review using our clinic review tool, if applicable. No additional management support is needed unless otherwise documented below in the visit note. 

## 2014-12-09 NOTE — Progress Notes (Signed)
Patient ID: Alexandra Delgado, female   DOB: 08/04/1966, 49 y.o.   MRN: [id number]   Subjective:    Patient ID: Alexandra Delgado, female    DOB: 08/05/1966, 49 y.o.   MRN: [id number]  HPI  Patient here for a scheduled follow up.  Changed jobs in 07/2014.  Started working over 50 hours per week.  Not exercising now.  Stopped going to the gym.  She does walk her dogs.  She is eating fast food.  Back to drinking cokes.  Not going to the weight program.  Request pheneramine.     Past Medical History  Diagnosis Date  . Depression     Current Outpatient Prescriptions on File Prior to Visit  Medication Sig Dispense Refill  . sertraline (ZOLOFT) 100 MG tablet TAKE 1 TABLET (100 MG TOTAL) BY MOUTH DAILY. 30 tablet 2   No current facility-administered medications on file prior to visit.    Review of Systems  Constitutional: Negative for appetite change.       Concern over weight gain.  Not exercising.  Not watching her diet.    HENT: Negative for congestion and sinus pressure.   Respiratory: Negative for cough, chest tightness and shortness of breath.   Cardiovascular: Negative for chest pain, palpitations and leg swelling.  Gastrointestinal: Negative for nausea, vomiting, abdominal pain and diarrhea.  Genitourinary: Negative for dysuria and difficulty urinating.  Skin: Negative for color change and rash.  Neurological: Negative for dizziness, light-headedness and headaches.       Objective:    Physical Exam  Constitutional: She appears well-developed and well-nourished. No distress.  HENT:  Nose: Nose normal.  Mouth/Throat: Oropharynx is clear and moist.  Neck: Neck supple. No thyromegaly present.  Cardiovascular: Normal rate and regular rhythm.   Pulmonary/Chest: Breath sounds normal. No respiratory distress. She has no wheezes.  Abdominal: Soft. Bowel sounds are normal. There is no tenderness.  Musculoskeletal: She exhibits no edema or tenderness.  Lymphadenopathy:    She has no  cervical adenopathy.  Skin: No rash noted. No erythema.    BP 110/70 mmHg  Pulse 68  Temp(Src) 97.7 F (36.5 C) (Oral)  Ht 5' 0.85" (1.546 m)  Wt 139 lb 2 oz (63.107 kg)  BMI 26.40 kg/m2  SpO2 98%  LMP 09/30/2009 Wt Readings from Last 3 Encounters:  12/09/14 139 lb 2 oz (63.107 kg)  06/23/14 125 lb (56.7 kg)  06/17/14 124 lb 8 oz (56.473 kg)     Lab Results  Component Value Date   WBC 5.5 01/01/2013   HGB 13.7 01/01/2013   HCT 40.8 01/01/2013   PLT 260.0 01/01/2013   GLUCOSE 71 02/15/2013   CHOL 222* 01/01/2013   TRIG 69.0 01/01/2013   HDL 63.20 01/01/2013   LDLDIRECT 148.7 01/01/2013   ALT 15 01/01/2013   AST 16 01/01/2013   NA 139 02/15/2013   K 4.9 02/15/2013   CL 104 02/15/2013   CREATININE 1.1 02/15/2013   BUN 12 02/15/2013   CO2 26 02/15/2013   TSH 2.64 01/01/2013       Assessment & Plan:   Problem List Items Addressed This Visit    None       Dale DurhamSCOTT, Tasheika Kitzmiller, MD

## 2014-12-17 ENCOUNTER — Encounter: Payer: Self-pay | Admitting: Internal Medicine

## 2014-12-17 DIAGNOSIS — Z0000 Encounter for general adult medical examination without abnormal findings: Secondary | ICD-10-CM | POA: Insufficient documentation

## 2014-12-17 DIAGNOSIS — E663 Overweight: Secondary | ICD-10-CM | POA: Insufficient documentation

## 2014-12-17 DIAGNOSIS — Z0001 Encounter for general adult medical examination with abnormal findings: Secondary | ICD-10-CM | POA: Insufficient documentation

## 2014-12-17 NOTE — Assessment & Plan Note (Signed)
Physical 03/25/14.  Mammogram 07/29/14 - Birads I.   PAP 02/14/13 - negative pap and negative HPV.

## 2014-12-17 NOTE — Assessment & Plan Note (Signed)
Doing well on zoloft.  Stress is getting better at work.  Follow.

## 2014-12-17 NOTE — Assessment & Plan Note (Signed)
Not watching her diet.  Not exercising.  Discussed at length with her today.  Hold on medication.  Would like to get her back in her routine of dieting and exercise.  Hopefully can lose weight without medication.  Follow.

## 2015-01-24 ENCOUNTER — Ambulatory Visit: Payer: BLUE CROSS/BLUE SHIELD | Admitting: Internal Medicine

## 2015-01-30 ENCOUNTER — Other Ambulatory Visit: Payer: Self-pay | Admitting: Internal Medicine

## 2015-03-20 ENCOUNTER — Telehealth: Payer: Self-pay | Admitting: Internal Medicine

## 2015-03-20 NOTE — Telephone Encounter (Signed)
Ok.  Let me know if I need to do anything.   

## 2015-03-20 NOTE — Telephone Encounter (Signed)
Pt. Is requesting to be transferred to the [facility] office to see Dr. Ermalene Searing because that office is closer to where she lives.

## 2015-03-27 ENCOUNTER — Ambulatory Visit (INDEPENDENT_AMBULATORY_CARE_PROVIDER_SITE_OTHER): Payer: BLUE CROSS/BLUE SHIELD | Admitting: Primary Care

## 2015-03-27 ENCOUNTER — Encounter: Payer: Self-pay | Admitting: Primary Care

## 2015-03-27 VITALS — BP 110/72 | HR 87 | Temp 97.7°F | Ht 61.0 in | Wt 145.4 lb

## 2015-03-27 DIAGNOSIS — F329 Major depressive disorder, single episode, unspecified: Secondary | ICD-10-CM | POA: Diagnosis not present

## 2015-03-27 DIAGNOSIS — B029 Zoster without complications: Secondary | ICD-10-CM

## 2015-03-27 DIAGNOSIS — F32A Depression, unspecified: Secondary | ICD-10-CM

## 2015-03-27 MED ORDER — SERTRALINE HCL 100 MG PO TABS: ORAL_TABLET | ORAL | Status: DC

## 2015-03-27 NOTE — Assessment & Plan Note (Signed)
Managed on Zoloft 100 mg for the past 10 years. Feels well managed on this dose. Denies SI/HI, headaches, GI upset. Continue same. Follow up in 6 months.

## 2015-03-27 NOTE — Progress Notes (Signed)
Pre visit review using our clinic review tool, if applicable. No additional management support is needed unless otherwise documented below in the visit note. 

## 2015-03-27 NOTE — Patient Instructions (Signed)
Refills for your Zoloft have been provided. Call your insurance company regarding the shingles vaccine. You may make a "nurse only" visit for the administration of the vaccine.  It was very nice meeting you!  Follow up in 6 months.

## 2015-03-27 NOTE — Progress Notes (Signed)
   Subjective:    Patient ID: Alexandra Delgado, female    DOB: [birth year], 49 y.o.   MRN: [id number]  HPI  Alexandra Delgado is a 49 year old female who presents today to establish care. She is a transfer from the [facility].  1) Depression: Diagnosed 10 years ago and has been managed on Zoloft 100 mg since. She feels well managed on Zoloft at the 100 mg dose. Denies SI/HI, sadness, tearfulness, headaches, GI upset. She is requesting refills today.  Review of Systems  Constitutional: Negative for fatigue and unexpected weight change.  HENT: Negative for rhinorrhea.   Respiratory: Negative for cough and shortness of breath.   Cardiovascular: Negative for chest pain.  Gastrointestinal: Negative for diarrhea and constipation.  Genitourinary: Negative for difficulty urinating.       Postmenopausal, denies vaginal bleed.   Musculoskeletal: Negative for myalgias and arthralgias.  Skin: Negative for rash.  Neurological: Negative for dizziness and headaches.  Psychiatric/Behavioral: Negative for suicidal ideas.       See HPI.       Past Medical History  Diagnosis Date  . Depression     History   Social History  . Marital Status: Divorced    Spouse Name: N/A  . Number of Children: 0  . Years of Education: N/A   Occupational History  .     Social History Main Topics  . Smoking status: Never Smoker   . Smokeless tobacco: Never Used  . Alcohol Use: No  . Drug Use: No  . Sexual Activity: Not on file   Other Topics Concern  . Not on file   Social History Narrative   Divorced.   Works as Environmental health practitioner.   No children.   Enjoys spending time with her dogs, hiking.       Past Surgical History  Procedure Laterality Date  . No past surgeries      Family History  Problem Relation Age of Onset  . Arthritis Mother   . Hyperlipidemia Mother   . Hypertension Mother   . Hyperlipidemia Father   . Heart disease Father   . Hypertension Father   .  Kidney disease Father   . Diabetes Father   . Breast cancer Maternal Aunt     x2    Allergies  Allergen Reactions  . Erythromycin Other (See Comments)    GI upset    No current outpatient prescriptions on file prior to visit.   No current facility-administered medications on file prior to visit.    BP 110/72 mmHg  Pulse 87  Temp(Src) 97.7 F (36.5 C) (Oral)  Ht 5\' 1"  (1.549 m)  Wt 145 lb 6.4 oz (65.953 kg)  BMI 27.49 kg/m2  SpO2 97%  LMP 09/30/2009    Objective:   Physical Exam  Constitutional: She is oriented to person, place, and time. She appears well-nourished.  Cardiovascular: Normal rate and regular rhythm.   Pulmonary/Chest: Effort normal and breath sounds normal.  Neurological: She is alert and oriented to person, place, and time.  Skin: Skin is warm and dry.  Psychiatric: She has a normal mood and affect.          Assessment & Plan:

## 2015-03-27 NOTE — Assessment & Plan Note (Signed)
Has not completed the vaccine. She is to call her insurance company regarding coverage. She will make nurse visit for administration.

## 2015-05-24 ENCOUNTER — Other Ambulatory Visit: Payer: Self-pay | Admitting: Primary Care

## 2015-05-24 NOTE — Telephone Encounter (Signed)
Tried to call patient. Voicemail is not set up. Will try again. 

## 2015-05-24 NOTE — Telephone Encounter (Signed)
Electronically refill request  Last prescribed on 8 weeks ago 03/27/2015 sertraline (ZOLOFT) 100 MG tablet Dispense: 30 tablet   Refills: 5      Patient was last seen on 03/27/15. No future apt.

## 2015-05-25 NOTE — Telephone Encounter (Signed)
Tried to call patient. Voicemail is not set up. Will try again.

## 2015-05-29 NOTE — Telephone Encounter (Signed)
Could not get a hold of patient. Will sent letter to have patient schedule apt.

## 2015-10-03 ENCOUNTER — Ambulatory Visit (INDEPENDENT_AMBULATORY_CARE_PROVIDER_SITE_OTHER): Payer: BLUE CROSS/BLUE SHIELD | Admitting: Primary Care

## 2015-10-03 ENCOUNTER — Encounter: Payer: Self-pay | Admitting: Primary Care

## 2015-10-03 VITALS — BP 124/78 | HR 64 | Temp 97.7°F | Ht 61.0 in | Wt 163.0 lb

## 2015-10-03 DIAGNOSIS — Z23 Encounter for immunization: Secondary | ICD-10-CM

## 2015-10-03 DIAGNOSIS — E669 Obesity, unspecified: Secondary | ICD-10-CM

## 2015-10-03 DIAGNOSIS — E66811 Obesity, class 1: Secondary | ICD-10-CM

## 2015-10-03 DIAGNOSIS — F32A Depression, unspecified: Secondary | ICD-10-CM

## 2015-10-03 DIAGNOSIS — F329 Major depressive disorder, single episode, unspecified: Secondary | ICD-10-CM

## 2015-10-03 MED ORDER — SERTRALINE HCL 100 MG PO TABS: ORAL_TABLET | ORAL | Status: DC

## 2015-10-03 NOTE — Progress Notes (Signed)
Pre visit review using our clinic review tool, if applicable. No additional management support is needed unless otherwise documented below in the visit note. 

## 2015-10-03 NOTE — Assessment & Plan Note (Signed)
Currently managed on Zoloft 100 mg. Feels well managed at this dose. Refills provided.

## 2015-10-03 NOTE — Patient Instructions (Signed)
Refills have been provided for your Zoloft.  Schedule a physical in late March or early April 2017.  It is important that you improve your diet. Please limit carbohydrates in the form of white bread, rice, pasta, sweets, fast food, sugary drinks, etc. Increase your consumption of fresh fruits and vegetables.  You need to consume about 2 liters of water daily.  It was a pleasure to see you today!

## 2015-10-03 NOTE — Progress Notes (Signed)
   Subjective:    Patient ID: Jacklyn Shellammy Melick, female    DOB: 08/14/1966, 50 y.o.   MRN: [id number]  HPI  Ms. Pawlicki is a 50 year old female who presents today for follow up of chronic conditions.  1) Depression: Currently managed on Zoloft 100 mg and has been for 10 years. Denies upset stomach, nausea, SI/HI. Feels well managed at current dose.   2) Obesity: Longstanding history of with steady weight gain. Weight gain of 18 pounds since last visit in June. Endorses a poor diet.  Her diet currently consists of: Breakfast: Coke Lunch: Fast food most days Dinner: Brett Albinoizza, eats out at [employer]. Occasional pasta. No home cooked meals. Snacks: Coke Dessert: Twice weekly.  Beverages: Coffee, Coke. No water consumption  Exercise: She does not currently exercise.   Wt Readings from Last 3 Encounters:  10/03/15 163 lb (73.936 kg)  03/27/15 145 lb 6.4 oz (65.953 kg)  12/09/14 139 lb 2 oz (63.107 kg)     Review of Systems  Respiratory: Negative for shortness of breath.   Cardiovascular: Negative for chest pain.  Gastrointestinal: Negative for nausea.  Neurological: Negative for headaches.  Psychiatric/Behavioral: Negative for suicidal ideas.       Past Medical History  Diagnosis Date  . Depression     Social History   Social History  . Marital Status: Divorced    Spouse Name: N/A  . Number of Children: 0  . Years of Education: N/A   Occupational History  .     Social History Main Topics  . Smoking status: Never Smoker   . Smokeless tobacco: Never Used  . Alcohol Use: No  . Drug Use: No  . Sexual Activity: Not on file   Other Topics Concern  . Not on file   Social History Narrative   Divorced.   Works as Environmental health practitioneradministrative assistant.   No children.   Enjoys spending time with her dogs, hiking.       Past Surgical History  Procedure Laterality Date  . No past surgeries      Family History  Problem Relation Age of Onset  . Arthritis Mother   .  Hyperlipidemia Mother   . Hypertension Mother   . Hyperlipidemia Father   . Heart disease Father   . Hypertension Father   . Kidney disease Father   . Diabetes Father   . Breast cancer Maternal Aunt     x2    Allergies  Allergen Reactions  . Erythromycin Other (See Comments)    GI upset    No current outpatient prescriptions on file prior to visit.   No current facility-administered medications on file prior to visit.    BP 124/78 mmHg  Pulse 64  Temp(Src) 97.7 F (36.5 C) (Oral)  Ht 5\' 1"  (1.549 m)  Wt 163 lb (73.936 kg)  BMI 30.81 kg/m2  SpO2 97%  LMP 09/30/2009    Objective:   Physical Exam  Constitutional: She appears well-nourished.  Cardiovascular: Normal rate and regular rhythm.   Pulmonary/Chest: Effort normal and breath sounds normal.  Skin: Skin is warm and dry.  Psychiatric: She has a normal mood and affect.          Assessment & Plan:

## 2015-10-03 NOTE — Assessment & Plan Note (Signed)
Weight gain of 18 pounds since last visit. Spent a long time discussing current diet and offering advice towards changes.  Limit coke, sweets, fast food. Increase consumption of lean protein, vegetables, water. Will continue to monitor.

## 2015-10-04 NOTE — Addendum Note (Signed)
Addended by: Tawnya CrookSAMBATH, Cintya Daughety on: 10/04/2015 11:52 AM   Modules accepted: Orders

## 2016-10-23 ENCOUNTER — Other Ambulatory Visit: Payer: Self-pay | Admitting: Primary Care

## 2016-10-23 DIAGNOSIS — F329 Major depressive disorder, single episode, unspecified: Secondary | ICD-10-CM

## 2016-10-23 DIAGNOSIS — F32A Depression, unspecified: Secondary | ICD-10-CM

## 2016-10-23 NOTE — Telephone Encounter (Signed)
Ok to refill? Electronically refill request for   sertraline (ZOLOFT) 100 MG tablet  Last prescribed and seen on 10/03/2015.

## 2016-10-24 NOTE — Telephone Encounter (Signed)
Tried to call patient but could not leave message since voicemail box is full. 

## 2016-10-29 NOTE — Telephone Encounter (Signed)
Sending letter patient requesting office for further refills

## 2016-10-29 NOTE — Telephone Encounter (Signed)
Tried to call patient but could not leave message since voicemail box is full. 

## 2017-01-29 ENCOUNTER — Ambulatory Visit (INDEPENDENT_AMBULATORY_CARE_PROVIDER_SITE_OTHER): Payer: BLUE CROSS/BLUE SHIELD | Admitting: Primary Care

## 2017-01-29 ENCOUNTER — Encounter: Payer: Self-pay | Admitting: Primary Care

## 2017-01-29 VITALS — BP 122/82 | HR 68 | Temp 97.6°F | Ht 61.0 in | Wt 138.1 lb

## 2017-01-29 DIAGNOSIS — F3342 Major depressive disorder, recurrent, in full remission: Secondary | ICD-10-CM | POA: Diagnosis not present

## 2017-01-29 DIAGNOSIS — Z1231 Encounter for screening mammogram for malignant neoplasm of breast: Secondary | ICD-10-CM | POA: Diagnosis not present

## 2017-01-29 DIAGNOSIS — Z23 Encounter for immunization: Secondary | ICD-10-CM

## 2017-01-29 DIAGNOSIS — Z0000 Encounter for general adult medical examination without abnormal findings: Secondary | ICD-10-CM

## 2017-01-29 DIAGNOSIS — K047 Periapical abscess without sinus: Secondary | ICD-10-CM

## 2017-01-29 DIAGNOSIS — Z1239 Encounter for other screening for malignant neoplasm of breast: Secondary | ICD-10-CM

## 2017-01-29 DIAGNOSIS — E663 Overweight: Secondary | ICD-10-CM

## 2017-01-29 LAB — COMPREHENSIVE METABOLIC PANEL
ALT: 9 U/L (ref 0–35)
AST: 14 U/L (ref 0–37)
Albumin: 4.4 g/dL (ref 3.5–5.2)
Alkaline Phosphatase: 75 U/L (ref 39–117)
BUN: 15 mg/dL (ref 6–23)
CALCIUM: 9.4 mg/dL (ref 8.4–10.5)
CHLORIDE: 105 meq/L (ref 96–112)
CO2: 27 meq/L (ref 19–32)
Creatinine, Ser: 1.04 mg/dL (ref 0.40–1.20)
GFR: 59.13 mL/min — AB (ref >60.00)
Glucose, Bld: 83 mg/dL (ref 70–99)
Potassium: 4.9 mEq/L (ref 3.5–5.1)
Sodium: 138 mEq/L (ref 135–145)
Total Bilirubin: 0.6 mg/dL (ref 0.2–1.2)
Total Protein: 7 g/dL (ref 6.0–8.3)

## 2017-01-29 LAB — HEMOGLOBIN A1C: Hgb A1c MFr Bld: 5.3 % (ref 4.6–6.5)

## 2017-01-29 LAB — LIPID PANEL
CHOLESTEROL: 231 mg/dL — AB (ref 0–200)
HDL: 68.6 mg/dL (ref >39.00)
LDL Cholesterol: 145 mg/dL — ABNORMAL HIGH (ref 0–99)
NonHDL: 162.85
TRIGLYCERIDES: 89 mg/dL (ref 0.0–149.0)
Total CHOL/HDL Ratio: 3
VLDL: 17.8 mg/dL (ref 0.0–40.0)

## 2017-01-29 MED ORDER — AMOXICILLIN 875 MG PO TABS: 875.0000 mg | ORAL_TABLET | Freq: Two times a day (BID) | ORAL | 0 refills | Status: DC

## 2017-01-29 NOTE — Progress Notes (Signed)
Subjective:    Patient ID: Alexandra Delgado, female    DOB: July 14, 1966, 51 y.o.   MRN: [id number]  HPI  Ms. Nilan is a 51 year old female who presents today for complete physical.  1) Toothache: Present for the past 1 week that is located to the right lower molar. She has a history of bridge placement years ago and had an infection to that area one year ago. Her pain is worse with eating and drinking, pain improves after eating. She has not yet followed with a dentist as she cannot afford.  Immunizations: -Tetanus: Has not completed in 10 years.  -Influenza: Completed in 2017   Diet: She endorses a fair diet. Breakfast: Crackers Lunch: Chicken sandwich (fast food) Dinner: Development worker, community Snacks: None Desserts: Three times weekly Beverages: Diet soft drinks, some water (2 bottles daily)  Exercise: She is not currently exercising. Eye exam: Completed several years ago Dental exam: Has not recently been.  Colonoscopy: Never completed. Interested in [employer]. Pap Smear: Completed in 2014. No prior abnormal's. Would like to defer to 2019. Mammogram: Completed in 2015. Due.   Review of Systems  Constitutional: Negative for fever and unexpected weight change.  HENT: Negative for rhinorrhea.        Toothache.  Respiratory: Negative for cough and shortness of breath.   Cardiovascular: Negative for chest pain.  Gastrointestinal: Negative for constipation and diarrhea.  Genitourinary: Negative for difficulty urinating and menstrual problem.  Musculoskeletal: Negative for arthralgias and myalgias.  Skin: Negative for rash.  Allergic/Immunologic: Negative for environmental allergies.  Neurological: Negative for dizziness, numbness and headaches.  Psychiatric/Behavioral:       Feels well managed on Zoloft.       Past Medical History:  Diagnosis Date  . Depression      Social History   Social History  . Marital status: Divorced    Spouse name: N/A  . Number of children:  0  . Years of education: N/A   Occupational History  .  [employer]   Social History Main Topics  . Smoking status: Never Smoker  . Smokeless tobacco: Never Used  . Alcohol use No  . Drug use: No  . Sexual activity: Not on file   Other Topics Concern  . Not on file   Social History Narrative   Divorced.   Works as Environmental health practitioner.   No children.   Enjoys spending time with her dogs, hiking.       Past Surgical History:  Procedure Laterality Date  . NO PAST SURGERIES      Family History  Problem Relation Age of Onset  . Arthritis Mother   . Hyperlipidemia Mother   . Hypertension Mother   . Hyperlipidemia Father   . Heart disease Father   . Hypertension Father   . Kidney disease Father   . Diabetes Father   . Breast cancer Maternal Aunt     x2    Allergies  Allergen Reactions  . Erythromycin Other (See Comments)    GI upset    Current Outpatient Prescriptions on File Prior to Visit  Medication Sig Dispense Refill  . sertraline (ZOLOFT) 100 MG tablet TAKE 1 TABLET (100 MG TOTAL) BY MOUTH DAILY. 30 tablet 0   No current facility-administered medications on file prior to visit.     BP 122/82   Pulse 68   Temp 97.6 F (36.4 C) (Oral)   Ht  (1.549 m)   Wt 138 lb 1.9 oz (62.7 kg)  LMP 09/30/2009   SpO2 98%   BMI 26.10 kg/m    Objective:   Physical Exam  Constitutional: She is oriented to person, place, and time. She appears well-nourished.  HENT:  Right Ear: Tympanic membrane and ear canal normal.  Left Ear: Tympanic membrane and ear canal normal.  Nose: Nose normal.  Mouth/Throat: Oropharynx is clear and moist.  Mild erythema and swelling to molar of concern. No drainage.  Eyes: Conjunctivae and EOM are normal. Pupils are equal, round, and reactive to light.  Neck: Neck supple. No thyromegaly present.  Cardiovascular: Normal rate and regular rhythm.   No murmur heard. Pulmonary/Chest: Effort normal and breath sounds normal. She has  no rales.  Abdominal: Soft. Bowel sounds are normal. There is no tenderness.  Musculoskeletal: Normal range of motion.  Lymphadenopathy:    She has no cervical adenopathy.  Neurological: She is alert and oriented to person, place, and time. She has normal reflexes. No cranial nerve deficit.  Skin: Skin is warm and dry. No rash noted.  Psychiatric: She has a normal mood and affect.          Assessment & Plan:  Dental pain:  Located to right molar and area of bridge. Exam today with evidence of possible infection under bridge. Prescription for Amoxil course sent to pharmacy. Strongly recommended follow-up with dentist as bridge will likely need to be repaired or replaced.  Morrie Sheldon, NP

## 2017-01-29 NOTE — Assessment & Plan Note (Signed)
Doing well on Zoloft 100 mg. Denies SI/HI. Continue current regimen.

## 2017-01-29 NOTE — Progress Notes (Signed)
Pre visit review using our clinic review tool, if applicable. No additional management support is needed unless otherwise documented below in the visit note. 

## 2017-01-29 NOTE — Assessment & Plan Note (Signed)
Commended her on changes in her diet, recommended she start exercising.

## 2017-01-29 NOTE — Addendum Note (Signed)
Addended by: Tawnya Crook on: 01/29/2017 03:07 PM   Modules accepted: Orders

## 2017-01-29 NOTE — Assessment & Plan Note (Signed)
Immunizations up-to-date. Mammogram due, ordered and pending. Pap due, she would like to defer until 2019. Information provided regarding ColoGuard for colon cancer screening as she declines colonoscopy. Exam overall stable. Labs pending. Commended her on improvements in diet, recommended regular exercise. Follow-up in one year for reevaluation.

## 2017-01-29 NOTE — Patient Instructions (Signed)
Start amoxicillin antibiotics. Take 1 tablet by mouth twice daily for 7 days.  Call the [facility] to schedule your mammogram.  Complete the Cologuard test as discussed.   Continue to work on improvements in your diet. Reduce consumption of fast/fried foods.  Start exercising. You should be getting 150 minutes of moderate intensity exercise weekly.  Ensure you are consuming 64 ounces of water daily.  We will complete your Pap next year.  You were provided with a Tetanus vaccination which will cover you for 10 years.   Complete lab work prior to leaving today. I will notify you of your results once received.   Follow up in 1 year for your annual exam or sooner if needed.  It was a pleasure to see you today!

## 2017-02-03 ENCOUNTER — Encounter: Payer: Self-pay | Admitting: Primary Care

## 2017-02-03 DIAGNOSIS — F3342 Major depressive disorder, recurrent, in full remission: Secondary | ICD-10-CM

## 2017-02-03 MED ORDER — SERTRALINE HCL 100 MG PO TABS: ORAL_TABLET | ORAL | 3 refills | Status: DC

## 2017-02-26 ENCOUNTER — Ambulatory Visit
Admission: RE | Admit: 2017-02-26 | Discharge: 2017-02-26 | Disposition: A | Discharge status: Home / Self Care | Payer: BLUE CROSS/BLUE SHIELD | Source: Ambulatory Visit | Attending: Primary Care | Admitting: Primary Care

## 2017-02-26 DIAGNOSIS — Z1231 Encounter for screening mammogram for malignant neoplasm of breast: Secondary | ICD-10-CM | POA: Insufficient documentation

## 2017-02-26 DIAGNOSIS — Z1239 Encounter for other screening for malignant neoplasm of breast: Secondary | ICD-10-CM

## 2017-03-05 ENCOUNTER — Encounter: Payer: Self-pay | Admitting: Primary Care

## 2017-03-05 DIAGNOSIS — Z1212 Encounter for screening for malignant neoplasm of rectum: Secondary | ICD-10-CM | POA: Diagnosis not present

## 2017-03-05 DIAGNOSIS — Z1211 Encounter for screening for malignant neoplasm of colon: Secondary | ICD-10-CM | POA: Diagnosis not present

## 2017-03-05 LAB — COLOGUARD

## 2017-03-13 ENCOUNTER — Telehealth: Payer: Self-pay | Admitting: Primary Care

## 2017-03-13 NOTE — Telephone Encounter (Signed)
Please notify patient that her Cologuard result was negative. We will repeat this in 3 years. 

## 2017-03-14 NOTE — Telephone Encounter (Signed)
Sending letter with results and Kate's comments for patient. 

## 2017-11-20 ENCOUNTER — Other Ambulatory Visit: Payer: Self-pay | Admitting: Primary Care

## 2017-11-20 DIAGNOSIS — F3342 Major depressive disorder, recurrent, in full remission: Secondary | ICD-10-CM

## 2017-12-26 ENCOUNTER — Ambulatory Visit: Payer: BLUE CROSS/BLUE SHIELD | Admitting: Primary Care

## 2017-12-26 ENCOUNTER — Encounter: Payer: Self-pay | Admitting: Primary Care

## 2017-12-26 VITALS — BP 118/78 | HR 67 | Temp 97.8°F | Wt 158.0 lb

## 2017-12-26 DIAGNOSIS — M799 Soft tissue disorder, unspecified: Secondary | ICD-10-CM | POA: Diagnosis not present

## 2017-12-26 DIAGNOSIS — F3342 Major depressive disorder, recurrent, in full remission: Secondary | ICD-10-CM

## 2017-12-26 DIAGNOSIS — M7989 Other specified soft tissue disorders: Secondary | ICD-10-CM

## 2017-12-26 MED ORDER — SERTRALINE HCL 100 MG PO TABS: ORAL_TABLET | ORAL | 3 refills | Status: DC

## 2017-12-26 NOTE — Patient Instructions (Signed)
Stop by the front desk and speak with either Shirlee LimerickMarion or Anast[place]ia regarding your ultrasound.  Continue Zoloft 100 mg tablets.  It was a pleasure to see you today!

## 2017-12-26 NOTE — Progress Notes (Signed)
Subjective:    Patient ID: Alexandra Delgado, female    DOB: 02/26/1966, 52 y.o.   MRN: [id number]  HPI  Alexandra Delgado is a 52 year old female who presents today with a chief complaint of skin mass. She is also requesting a refill of her Zoloft.  1) Skin Mass: Located to the right lateral neck that she first noticed 5 days ago. She's also noticed 2 smaller lumps to the right occipital head that same day. She denies pain, fevers, chills, body aches, recent tick bite, cough.   2) Depression: Currently managed on Zoloft 100 mg once daily. Denies SI/HI. She'd like a refill today. PHQ 9 score of 0 today.   Review of Systems  Constitutional: Negative for fever.  HENT: Negative for congestion, ear pain and sore throat.   Respiratory: Negative for cough.   Musculoskeletal: Negative for myalgias.  Skin:       Skin mass to right lateral neck and right occipital region of head  Psychiatric/Behavioral:       Denies SI/HI       Past Medical History:  Diagnosis Date  . Depression      Social History   Socioeconomic History  . Marital status: Divorced    Spouse name: Not on file  . Number of children: 0  . Years of education: Not on file  . Highest education level: Not on file  Occupational History    Employer: [employer]  Social Needs  . Financial resource strain: Not on file  . Food insecurity:    Worry: Not on file    Inability: Not on file  . Transportation needs:    Medical: Not on file    Non-medical: Not on file  Tobacco Use  . Smoking status: Never Smoker  . Smokeless tobacco: Never Used  Substance and Sexual Activity  . Alcohol use: No    Alcohol/week: 0.0 oz  . Drug use: No  . Sexual activity: Not on file  Lifestyle  . Physical activity:    Days per week: Not on file    Minutes per session: Not on file  . Stress: Not on file  Relationships  . Social connections:    Talks on phone: Not on file    Gets together: Not on file    Attends religious service: Not on  file    Active member of club or organization: Not on file    Attends meetings of clubs or organizations: Not on file    Relationship status: Not on file  . Intimate partner violence:    Fear of current or ex partner: Not on file    Emotionally abused: Not on file    Physically abused: Not on file    Forced sexual activity: Not on file  Other Topics Concern  . Not on file  Social History Narrative   Divorced.   Works as Environmental health practitioneradministrative assistant.   No children.   Enjoys spending time with her dogs, hiking.    Past Surgical History:  Procedure Laterality Date  . NO PAST SURGERIES      Family History  Problem Relation Age of Onset  . Arthritis Mother   . Hyperlipidemia Mother   . Hypertension Mother   . Hyperlipidemia Father   . Heart disease Father   . Hypertension Father   . Kidney disease Father   . Diabetes Father   . Breast cancer Maternal Aunt        x2    Allergies  Allergen Reactions  . Erythromycin Other (See Comments)    GI upset    Current Outpatient Medications on File Prior to Visit  Medication Sig Dispense Refill  . sertraline (ZOLOFT) 100 MG tablet TAKE 1 TABLET BY MOUTH EVERY DAY 90 tablet 0   No current facility-administered medications on file prior to visit.     BP 118/78   Pulse 67   Temp 97.8 F (36.6 C) (Oral)   Wt 158 lb (71.7 kg)   LMP 09/30/2009   SpO2 99%   BMI 29.85 kg/m    Objective:   Physical Exam  Constitutional: She appears well-nourished.  HENT:  Head:    Neck:  0.5 cm oblong feeling, immobile, soft mass to right upper lateral neck just lateral to sternocleidomastoid muscle. Non tender.  Cardiovascular: Normal rate and regular rhythm.  Pulmonary/Chest: Effort normal and breath sounds normal.  Skin: Skin is warm and dry.  2 small <0.25 mc soft, immobile masses to right occipital head.  Psychiatric: She has a normal mood and affect.          Assessment & Plan:  Soft Tissue Mass:  Located to right lateral  neck and posterior head as mentioned. Suspect benign cyst or lipoma. US soft tissue ordered for further evaluation. No s/s of infection or malignant cause.  Doreene Nest, NP

## 2017-12-26 NOTE — Assessment & Plan Note (Signed)
Doing well on Zoloft 100 mg, continue same. PHQ 9 score of 0 today. Refills sent to pharmacy.

## 2017-12-30 ENCOUNTER — Ambulatory Visit
Admission: RE | Admit: 2017-12-30 | Discharge: 2017-12-30 | Disposition: A | Discharge status: Home / Self Care | Payer: BLUE CROSS/BLUE SHIELD | Source: Ambulatory Visit | Attending: Primary Care | Admitting: Primary Care

## 2017-12-30 DIAGNOSIS — R229 Localized swelling, mass and lump, unspecified: Secondary | ICD-10-CM | POA: Diagnosis not present

## 2017-12-30 DIAGNOSIS — M799 Soft tissue disorder, unspecified: Secondary | ICD-10-CM | POA: Diagnosis not present

## 2017-12-30 DIAGNOSIS — M7989 Other specified soft tissue disorders: Secondary | ICD-10-CM

## 2018-05-12 DIAGNOSIS — R112 Nausea with vomiting, unspecified: Secondary | ICD-10-CM | POA: Diagnosis not present

## 2018-05-12 DIAGNOSIS — H8113 Benign paroxysmal vertigo, bilateral: Secondary | ICD-10-CM | POA: Diagnosis not present

## 2018-07-03 IMAGING — MG 2D DIGITAL SCREENING BILATERAL MAMMOGRAM WITH CAD AND ADJUNCT TO
8 of 12 series · 8 of 28 positions shown · non-contrast
Comparison: Previous exam(s).

CLINICAL DATA: Screening.

EXAM:
2D DIGITAL SCREENING BILATERAL MAMMOGRAM WITH CAD AND ADJUNCT TOMO

[L MLO synth-2D]
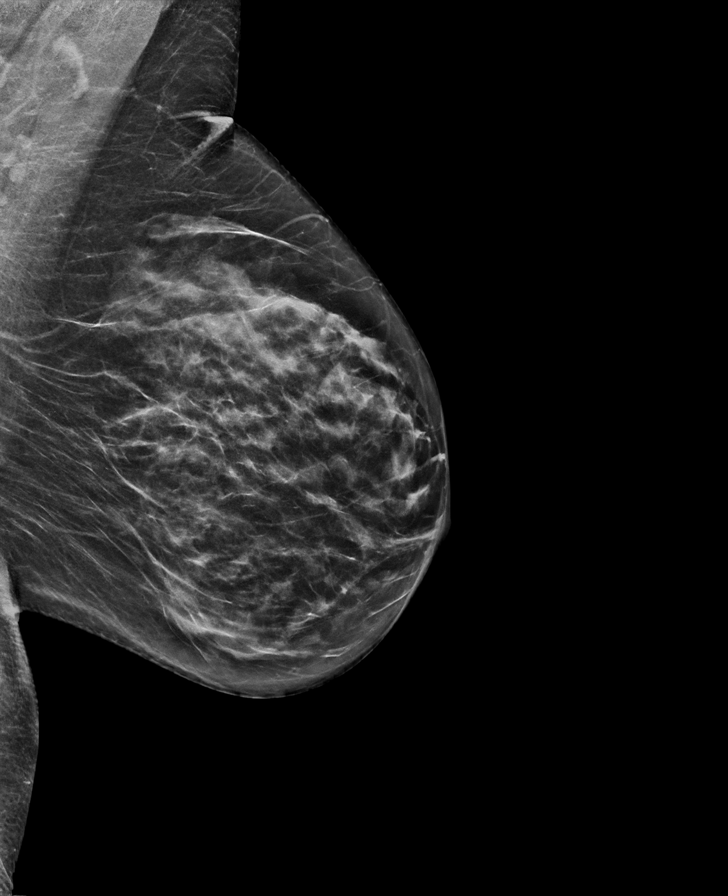

[L CC]
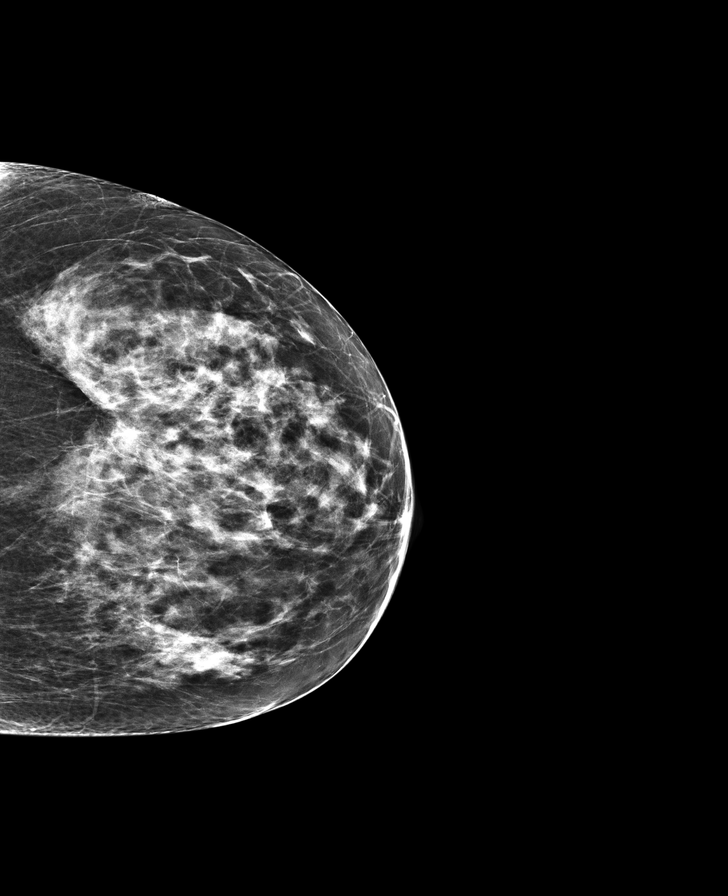

[R MLO synth-2D]
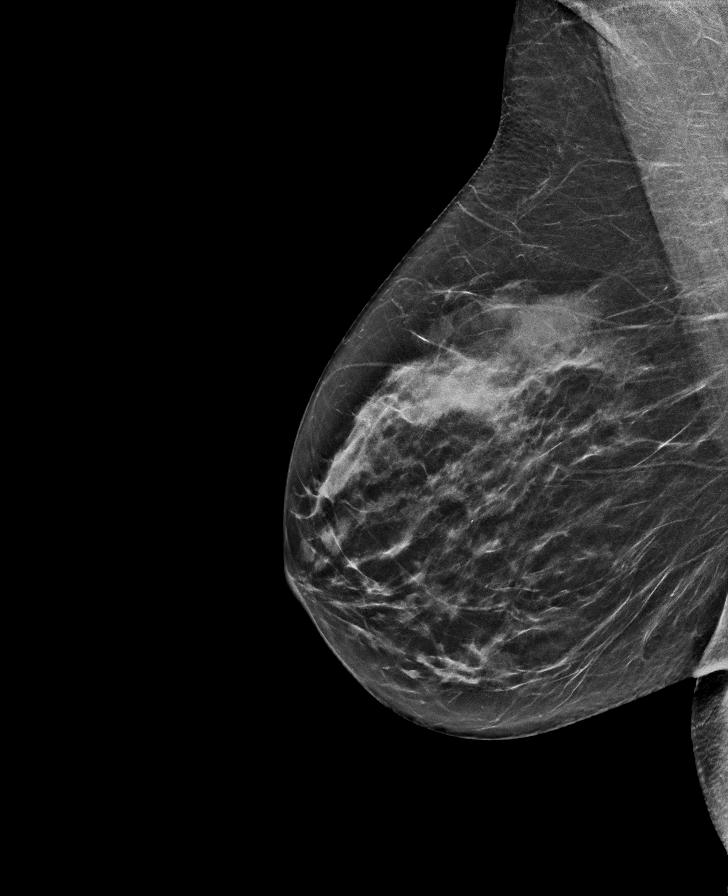

[R CC synth-2D]
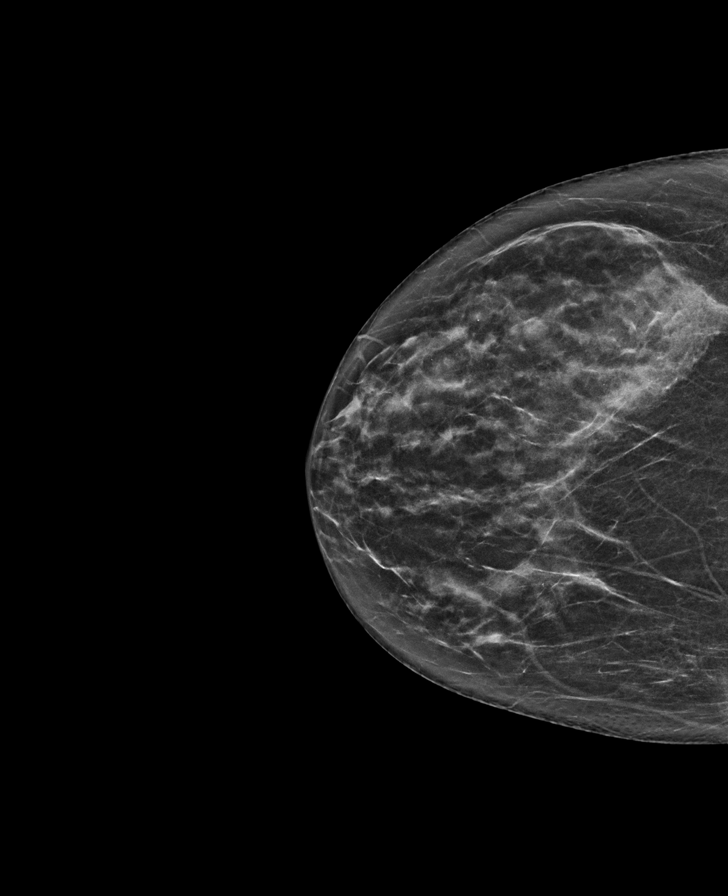

[R MLO]
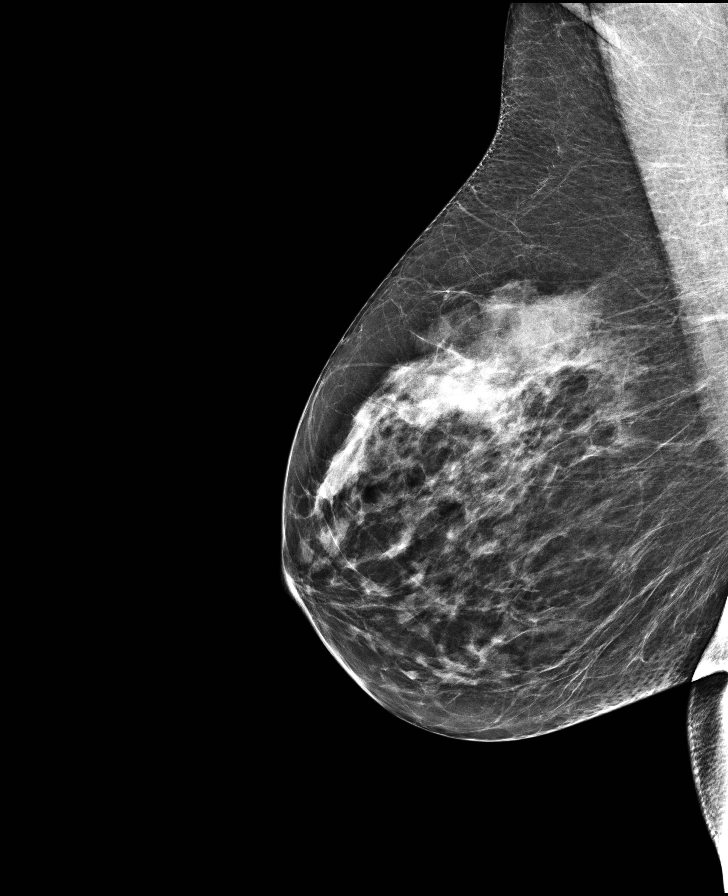

[R CC]
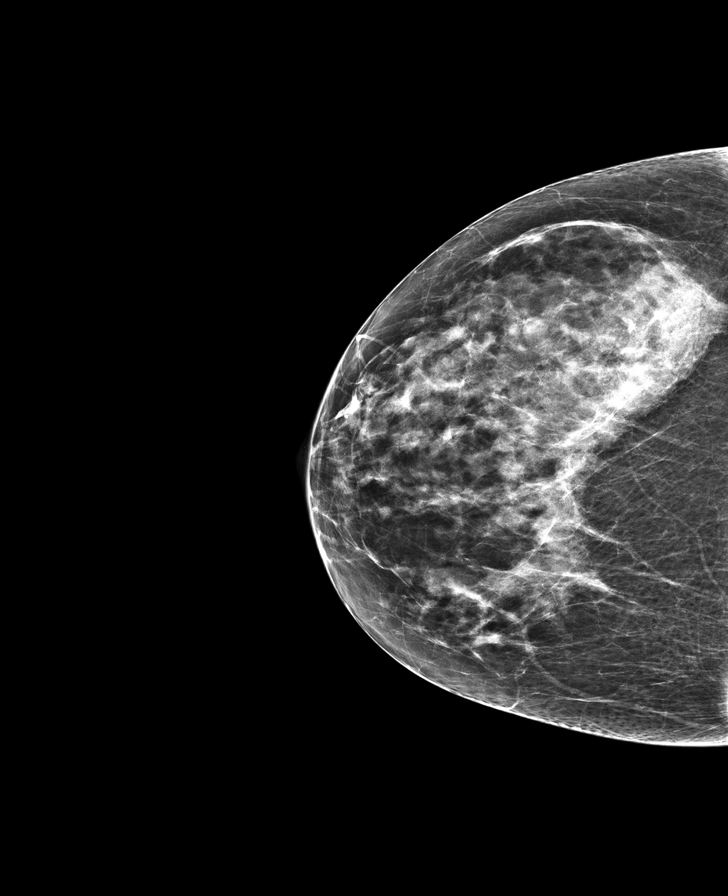

[L MLO]
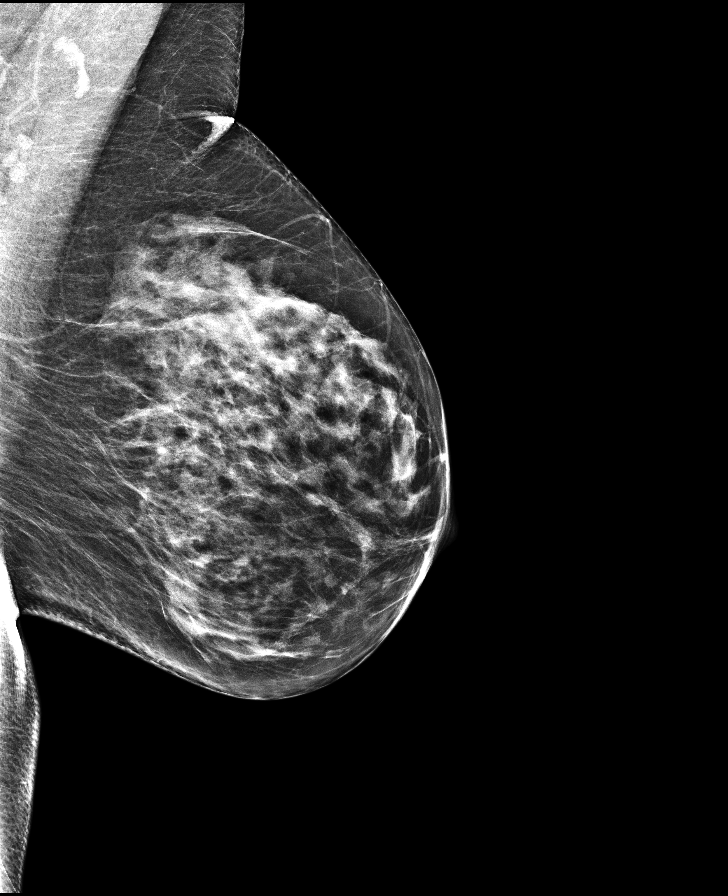

[L CC synth-2D]
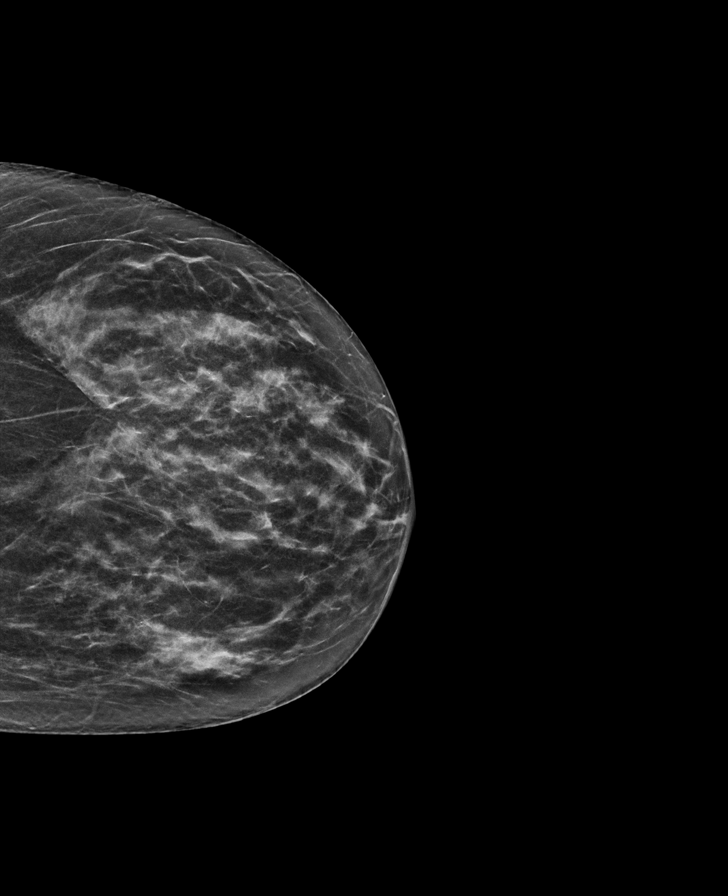

[8 of 28 positions shown; findings below may reference images not displayed]

ACR Breast Density Category c: The breast tissue is heterogeneously
dense, which may obscure small masses.
FINDINGS: There are no findings suspicious for malignancy. Images were
processed with CAD.
IMPRESSION: No mammographic evidence of malignancy. A result letter of this
screening mammogram will be mailed directly to the patient.

RECOMMENDATION:
Screening mammogram in one year. (Code:[id number]-[id number])

BI-RADS CATEGORY  1: Negative.

## 2018-07-27 ENCOUNTER — Other Ambulatory Visit: Payer: Self-pay | Admitting: Primary Care

## 2018-07-27 DIAGNOSIS — Z1231 Encounter for screening mammogram for malignant neoplasm of breast: Secondary | ICD-10-CM

## 2018-10-05 ENCOUNTER — Ambulatory Visit
Admission: RE | Admit: 2018-10-05 | Discharge: 2018-10-05 | Disposition: A | Discharge status: Home / Self Care | Payer: BLUE CROSS/BLUE SHIELD | Source: Ambulatory Visit | Attending: Primary Care | Admitting: Primary Care

## 2018-10-05 DIAGNOSIS — Z1231 Encounter for screening mammogram for malignant neoplasm of breast: Secondary | ICD-10-CM

## 2018-10-26 ENCOUNTER — Telehealth: Payer: BLUE CROSS/BLUE SHIELD | Admitting: Family

## 2018-10-26 DIAGNOSIS — R6889 Other general symptoms and signs: Secondary | ICD-10-CM | POA: Diagnosis not present

## 2018-10-26 MED ORDER — OSELTAMIVIR PHOSPHATE 75 MG PO CAPS: 75.0000 mg | ORAL_CAPSULE | Freq: Two times a day (BID) | ORAL | 0 refills | Status: DC

## 2018-10-26 NOTE — Progress Notes (Signed)

## 2018-11-26 DIAGNOSIS — D2272 Melanocytic nevi of left lower limb, including hip: Secondary | ICD-10-CM | POA: Diagnosis not present

## 2018-11-26 DIAGNOSIS — D225 Melanocytic nevi of trunk: Secondary | ICD-10-CM | POA: Diagnosis not present

## 2018-11-26 DIAGNOSIS — D2262 Melanocytic nevi of left upper limb, including shoulder: Secondary | ICD-10-CM | POA: Diagnosis not present

## 2018-11-26 DIAGNOSIS — D485 Neoplasm of uncertain behavior of skin: Secondary | ICD-10-CM | POA: Diagnosis not present

## 2018-11-26 DIAGNOSIS — D2261 Melanocytic nevi of right upper limb, including shoulder: Secondary | ICD-10-CM | POA: Diagnosis not present

## 2019-01-21 ENCOUNTER — Other Ambulatory Visit: Payer: Self-pay | Admitting: Primary Care

## 2019-01-21 DIAGNOSIS — F3342 Major depressive disorder, recurrent, in full remission: Secondary | ICD-10-CM

## 2019-03-04 ENCOUNTER — Encounter: Payer: Self-pay | Admitting: Primary Care

## 2019-03-04 ENCOUNTER — Ambulatory Visit (INDEPENDENT_AMBULATORY_CARE_PROVIDER_SITE_OTHER): Payer: BC Managed Care – PPO | Admitting: Primary Care

## 2019-03-04 DIAGNOSIS — E663 Overweight: Secondary | ICD-10-CM | POA: Diagnosis not present

## 2019-03-04 DIAGNOSIS — F3342 Major depressive disorder, recurrent, in full remission: Secondary | ICD-10-CM

## 2019-03-04 MED ORDER — SERTRALINE HCL 100 MG PO TABS: ORAL_TABLET | ORAL | 3 refills | Status: DC

## 2019-03-04 NOTE — Progress Notes (Signed)
Subjective:    Patient ID: Alexandra Delgado, female    DOB: 07/04/1966, 53 y.o.   MRN: [id number]  HPI  Virtual Visit via Video Note  I connected with Alexandra Delgado on 03/04/19 at 11:20 AM EDT by a video enabled telemedicine application and verified that I am speaking with the correct person using two identifiers.  Location: Patient: Home Provider: Office   I discussed the limitations of evaluation and management by telemedicine and the availability of in person appointments. The patient expressed understanding and agreed to proceed.  We attempted a video visit but the video failed. We had to carry out our visit mail via phone which lasted 6 min and 42 seconds.  History of Present Illness:  Alexandra Delgado is a 53 year old female who presents today for follow up and medication refill. Mammogram and colon cancer screening are up to date.   1) MDD: Currently managed on sertraline 100 mg daily. Overall feels well managed as she's been taking this for nearly 20 years.   2) Hyperlipidemia: Recent weight loss of 17 pounds through healthy diet and exercise. She joined [employer] and is walking daily. Her last lipid check was in 2018 with LDL of 145. She does plan on coming in for her pap smear this year, we will get labs at that point.   Wt Readings from Last 3 Encounters:  03/04/19 139 lb 8 oz (63.3 kg)  12/26/17 158 lb (71.7 kg)  01/29/17 138 lb 1.9 oz (62.7 kg)      Observations/Objective:  Alert and oriented. Appears well, not sickly. No distress. Speaking in complete sentences.   Assessment and Plan:  See problem based charting.  Follow Up Instructions:  Continue Zoloft 100 mg, I sent refills to your pharmacy.  Please schedule an appointment for your pap smear and cholesterol check as discussed.  Congratulations on your weight loss!  It was a pleasure to see you today!    I discussed the assessment and treatment plan with the patient. The patient was  provided an opportunity to ask questions and all were answered. The patient agreed with the plan and demonstrated an understanding of the instructions.   The patient was advised to call back or seek an in-person evaluation if the symptoms worsen or if the condition fails to improve as anticipated.     Doreene NestKatherine K Mikaele Stecher, NP    Review of Systems  Respiratory: Negative for shortness of breath.   Cardiovascular: Negative for chest pain.  Neurological: Negative for dizziness and headaches.  Psychiatric/Behavioral: Negative for suicidal ideas. The patient is not nervous/anxious.        Past Medical History:  Diagnosis Date  . Depression      Social History   Socioeconomic History  . Marital status: Divorced    Spouse name: Not on file  . Number of children: 0  . Years of education: Not on file  . Highest education level: Not on file  Occupational History    Employer: [employer]  Social Needs  . Financial resource strain: Not on file  . Food insecurity:    Worry: Not on file    Inability: Not on file  . Transportation needs:    Medical: Not on file    Non-medical: Not on file  Tobacco Use  . Smoking status: Never Smoker  . Smokeless tobacco: Never Used  Substance and Sexual Activity  . Alcohol use: No    Alcohol/week: 0.0 standard drinks  . Drug  use: No  . Sexual activity: Not on file  Lifestyle  . Physical activity:    Days per week: Not on file    Minutes per session: Not on file  . Stress: Not on file  Relationships  . Social connections:    Talks on phone: Not on file    Gets together: Not on file    Attends religious service: Not on file    Active member of club or organization: Not on file    Attends meetings of clubs or organizations: Not on file    Relationship status: Not on file  . Intimate partner violence:    Fear of current or ex partner: Not on file    Emotionally abused: Not on file    Physically abused: Not on file    Forced sexual activity: Not  on file  Other Topics Concern  . Not on file  Social History Narrative   Divorced.   Works as Environmental health practitioner.   No children.   Enjoys spending time with her dogs, hiking.    Past Surgical History:  Procedure Laterality Date  . NO PAST SURGERIES      Family History  Problem Relation Age of Onset  . Arthritis Mother   . Hyperlipidemia Mother   . Hypertension Mother   . Hyperlipidemia Father   . Heart disease Father   . Hypertension Father   . Kidney disease Father   . Diabetes Father   . Breast cancer Maternal Aunt        x2    Allergies  Allergen Reactions  . Erythromycin Other (See Comments)    GI upset    No current outpatient medications on file prior to visit.   No current facility-administered medications on file prior to visit.     Wt 139 lb 8 oz (63.3 kg)   LMP 09/30/2009   BMI 26.36 kg/m    Objective:   Physical Exam  Constitutional: She is oriented to person, place, and time.  Respiratory: Effort normal.  Neurological: She is alert and oriented to person, place, and time.  Psychiatric: She has a normal mood and affect.           Assessment & Plan:

## 2019-03-04 NOTE — Assessment & Plan Note (Signed)
Doing well on Zoloft 100 mg, continue same. Refill sent to pharmacy.

## 2019-03-04 NOTE — Assessment & Plan Note (Signed)
Weight loss of 17 pounds thorough lifestyle changes, commended her on this success!

## 2019-03-04 NOTE — Patient Instructions (Signed)
Continue Zoloft 100 mg, I sent refills to your pharmacy.  Please schedule an appointment for your pap smear and cholesterol check as discussed.  Congratulations on your weight loss!  It was a pleasure to see you today!

## 2020-01-10 ENCOUNTER — Other Ambulatory Visit: Payer: Self-pay

## 2020-01-10 ENCOUNTER — Ambulatory Visit: Payer: BC Managed Care – PPO | Attending: Internal Medicine

## 2020-01-10 DIAGNOSIS — Z23 Encounter for immunization: Secondary | ICD-10-CM

## 2020-01-10 NOTE — Progress Notes (Signed)
   Covid-19 Vaccination Clinic  Name:  Alexandra Delgado    MRN: [id number] DOB: Jul 18, 1966  01/10/2020  [name] was observed post Covid-19 immunization for 15 minutes without incident. She was provided with Vaccine Information Sheet and instruction to access the V-Safe system.   [name] was instructed to call 911 with any severe reactions post vaccine: Marland Kitchen Difficulty breathing  . Swelling of face and throat  . A fast heartbeat  . A bad rash all over body  . Dizziness and weakness   Immunizations Administered    Name Date Dose VIS Date Route   Pfizer COVID-19 Vaccine 01/10/2020 10:41 AM 0.3 mL 09/10/2019 Intramuscular   Manufacturer: [employer], [employer]   Lot: [id number]   NDC: [address]-4

## 2020-02-01 ENCOUNTER — Ambulatory Visit: Payer: BC Managed Care – PPO | Attending: Internal Medicine

## 2020-02-01 DIAGNOSIS — Z23 Encounter for immunization: Secondary | ICD-10-CM

## 2020-02-01 NOTE — Progress Notes (Signed)
   Covid-19 Vaccination Clinic  Name:  JAQUIA BENEDICTO    MRN: [id number] DOB: October 02, 1965  02/01/2020  Ms. Matty was observed post Covid-19 immunization for 15 minutes without incident. She was provided with Vaccine Information Sheet and instruction to access the V-Safe system.   Ms. Manfre was instructed to call 911 with any severe reactions post vaccine: Marland Kitchen Difficulty breathing  . Swelling of face and throat  . A fast heartbeat  . A bad rash all over body  . Dizziness and weakness   Immunizations Administered    Name Date Dose VIS Date Route   Pfizer COVID-19 Vaccine 02/01/2020  2:43 PM 0.3 mL 11/24/2018 Intramuscular   Manufacturer: [employer], [employer]   Lot: [id number]   NDC: [address]-9

## 2020-06-01 ENCOUNTER — Other Ambulatory Visit: Payer: Self-pay

## 2020-06-01 ENCOUNTER — Ambulatory Visit (INDEPENDENT_AMBULATORY_CARE_PROVIDER_SITE_OTHER): Payer: BC Managed Care – PPO | Admitting: Primary Care

## 2020-06-01 ENCOUNTER — Encounter: Payer: Self-pay | Admitting: Primary Care

## 2020-06-01 VITALS — BP 112/76 | HR 68 | Ht 61.0 in | Wt 157.0 lb

## 2020-06-01 DIAGNOSIS — Z1231 Encounter for screening mammogram for malignant neoplasm of breast: Secondary | ICD-10-CM | POA: Diagnosis not present

## 2020-06-01 DIAGNOSIS — E785 Hyperlipidemia, unspecified: Secondary | ICD-10-CM

## 2020-06-01 DIAGNOSIS — F3342 Major depressive disorder, recurrent, in full remission: Secondary | ICD-10-CM | POA: Diagnosis not present

## 2020-06-01 DIAGNOSIS — Z0000 Encounter for general adult medical examination without abnormal findings: Secondary | ICD-10-CM

## 2020-06-01 LAB — COMPREHENSIVE METABOLIC PANEL
ALT: 18 U/L (ref 0–35)
AST: 16 U/L (ref 0–37)
Albumin: 4.4 g/dL (ref 3.5–5.2)
Alkaline Phosphatase: 99 U/L (ref 39–117)
BUN: 11 mg/dL (ref 6–23)
CO2: 29 mEq/L (ref 19–32)
Calcium: 9.3 mg/dL (ref 8.4–10.5)
Chloride: 105 mEq/L (ref 96–112)
Creatinine, Ser: 1 mg/dL (ref 0.40–1.20)
GFR: 57.48 mL/min — ABNORMAL LOW (ref >60.00)
Glucose, Bld: 83 mg/dL (ref 70–99)
Potassium: 4.2 mEq/L (ref 3.5–5.1)
Sodium: 140 mEq/L (ref 135–145)
Total Bilirubin: 0.5 mg/dL (ref 0.2–1.2)
Total Protein: 6.8 g/dL (ref 6.0–8.3)

## 2020-06-01 LAB — CBC
HCT: 40.9 % (ref 36.0–46.0)
Hemoglobin: 14 g/dL (ref 12.0–15.0)
MCHC: 34.3 g/dL (ref 30.0–36.0)
MCV: 87.9 fl (ref 78.0–100.0)
Platelets: 273 10*3/uL (ref 150.0–400.0)
RBC: 4.65 Mil/uL (ref 3.87–5.11)
RDW: 12.7 % (ref 11.5–15.5)
WBC: 5.3 10*3/uL (ref 4.0–10.5)

## 2020-06-01 LAB — LIPID PANEL
Cholesterol: 212 mg/dL — ABNORMAL HIGH (ref 0–200)
HDL: 61.1 mg/dL (ref >39.00)
LDL Cholesterol: 129 mg/dL — ABNORMAL HIGH (ref 0–99)
NonHDL: 150.76
Total CHOL/HDL Ratio: 3
Triglycerides: 108 mg/dL (ref 0.0–149.0)
VLDL: 21.6 mg/dL (ref 0.0–40.0)

## 2020-06-01 LAB — HEMOGLOBIN A1C: Hgb A1c MFr Bld: 5.2 % (ref 4.6–6.5)

## 2020-06-01 MED ORDER — SERTRALINE HCL 100 MG PO TABS: ORAL_TABLET | ORAL | 3 refills | Status: DC

## 2020-06-01 NOTE — Assessment & Plan Note (Signed)
Noted on prior labs. Repeat lipid panel pending.  Discussed the importance of a healthy diet and regular exercise in order for weight loss, and to reduce the risk of any potential medical problems.

## 2020-06-01 NOTE — Assessment & Plan Note (Signed)
Declines influenza and Shingrix vaccines despite recommendations. Pap smear over due, she declines despite recommendations. Colon cancer screening due, she will check with insurance for coverage of Cologuard. Mammogram due, orders placed.  Discussed the importance of a healthy diet and regular exercise in order for weight loss, and to reduce the risk of any potential medical problems.  Exam today unremarkable. Labs pending.

## 2020-06-01 NOTE — Progress Notes (Signed)
Subjective:    Patient ID: Alexandra Delgado, female    DOB: 15-Feb-1966, 54 y.o.   MRN: [id number]  HPI  This visit occurred during the SARS-CoV-2 public health emergency.  Safety protocols were in place, including screening questions prior to the visit, additional usage of staff PPE, and extensive cleaning of exam room while observing appropriate contact time as indicated for disinfecting solutions.   Alexandra Delgado is a 54 year old female who presents today for complete physical.  Immunizations: -Tetanus: Completed in 2018 -Influenza: Declines  -Shingles: Never completed, declines today -Covid-19: Completed series  Diet: She endorses a poor diet.  Exercise: No regular exercise   Eye exam: Completed in 2020 Dental exam: Completes annually   Pap Smear: No recent pap smear, declines  Mammogram: Completed in January 2020, due Colonoscopy: Completed in 2018, declines today Hep C Screen: Declines   BP Readings from Last 3 Encounters:  06/01/20 112/76  12/26/17 118/78  01/29/17 122/82      Review of Systems  Constitutional: Negative for unexpected weight change.  HENT: Negative for rhinorrhea.   Eyes: Negative for visual disturbance.  Respiratory: Negative for cough and shortness of breath.   Cardiovascular: Negative for chest pain.  Gastrointestinal: Negative for constipation and diarrhea.  Genitourinary: Negative for difficulty urinating.  Musculoskeletal: Positive for arthralgias.  Skin: Negative for rash.  Allergic/Immunologic: Negative for environmental allergies.  Neurological: Negative for dizziness, numbness and headaches.  Psychiatric/Behavioral: The patient is not nervous/anxious.        Past Medical History:  Diagnosis Date   Depression      Social History   Socioeconomic History   Marital status: Divorced    Spouse name: Not on file   Number of children: 0   Years of education: Not on file   Highest education level: Not on file   Occupational History    Employer: [employer]  Tobacco Use   Smoking status: Never Smoker   Smokeless tobacco: Never Used  Substance and Sexual Activity   Alcohol use: No    Alcohol/week: 0.0 standard drinks   Drug use: No   Sexual activity: Not on file  Other Topics Concern   Not on file  Social History Narrative   Divorced.   Works as Environmental health practitioner.   No children.   Enjoys spending time with her dogs, hiking.   Social Determinants of Health   Financial Resource Strain:    Difficulty of Paying Living Expenses: Not on file  Food Insecurity:    Worried About Programme researcher, broadcasting/film/video in the Last Year: Not on file   [employer] of Food in the Last Year: Not on file  Transportation Needs:    Lack of Transportation (Medical): Not on file   Lack of Transportation (Non-Medical): Not on file  Physical Activity:    Days of Exercise per Week: Not on file   Minutes of Exercise per Session: Not on file  Stress:    Feeling of Stress : Not on file  Social Connections:    Frequency of Communication with Friends and Family: Not on file   Frequency of Social Gatherings with Friends and Family: Not on file   Attends Religious Services: Not on file   Active Member of Clubs or Organizations: Not on file   Attends Banker Meetings: Not on file   Marital Status: Not on file  Intimate Partner Violence:    Fear of Current or Ex-Partner: Not on file   Emotionally Abused:  Not on file   Physically Abused: Not on file   Sexually Abused: Not on file    Past Surgical History:  Procedure Laterality Date   NO PAST SURGERIES      Family History  Problem Relation Age of Onset   Arthritis Mother    Hyperlipidemia Mother    Hypertension Mother    Hyperlipidemia Father    Heart disease Father    Hypertension Father    Kidney disease Father    Diabetes Father    Breast cancer Maternal Aunt        x2    Allergies  Allergen Reactions    Erythromycin Other (See Comments)    GI upset    No current outpatient medications on file prior to visit.   No current facility-administered medications on file prior to visit.    BP 112/76    Pulse 68    Ht 5\' 1"  (1.549 m)    Wt 157 lb (71.2 kg)    LMP 09/30/2009    BMI 29.66 kg/m    Objective:   Physical Exam HENT:     Right Ear: Tympanic membrane and ear canal normal.     Left Ear: Tympanic membrane and ear canal normal.  Eyes:     Pupils: Pupils are equal, round, and reactive to light.  Cardiovascular:     Rate and Rhythm: Normal rate and regular rhythm.  Pulmonary:     Effort: Pulmonary effort is normal.     Breath sounds: Normal breath sounds.  Abdominal:     General: Bowel sounds are normal.     Palpations: Abdomen is soft.     Tenderness: There is no abdominal tenderness.  Musculoskeletal:        General: Normal range of motion.     Cervical back: Neck supple.  Skin:    General: Skin is warm and dry.  Neurological:     Mental Status: She is alert and oriented to person, place, and time.     Cranial Nerves: No cranial nerve deficit.     Deep Tendon Reflexes:     Reflex Scores:      Patellar reflexes are 2+ on the right side and 2+ on the left side. Psychiatric:        Mood and Affect: Mood normal.            Assessment & Plan:

## 2020-06-01 NOTE — Assessment & Plan Note (Signed)
Doing well on sertraline 100 mg daily, continue same.  Refills sent to pharmacy.

## 2020-06-01 NOTE — Patient Instructions (Signed)
Stop by the lab prior to leaving today. I will notify you of your results once received.   Start exercising. You should be getting 150 minutes of moderate intensity exercise weekly.  It's important to improve your diet by reducing consumption of fast food, fried food, processed snack foods, sugary drinks. Increase consumption of fresh vegetables and fruits, whole grains, water.  Ensure you are drinking 64 ounces of water daily.  Please reconsider the pap smear.  Call the breast center to schedule your mammogram.  Please notify me if you would like to proceed with Cologuard.  It was a pleasure to see you today!   Preventive Care 79-49 Years Old, Female Preventive care refers to visits with your health care provider and lifestyle choices that can promote health and wellness. This includes:  A yearly physical exam. This may also be called an annual well check.  Regular dental visits and eye exams.  Immunizations.  Screening for certain conditions.  Healthy lifestyle choices, such as eating a healthy diet, getting regular exercise, not using drugs or products that contain nicotine and tobacco, and limiting alcohol use. What can I expect for my preventive care visit? Physical exam Your health care provider will check your:  Height and weight. This may be used to calculate body mass index (BMI), which tells if you are at a healthy weight.  Heart rate and blood pressure.  Skin for abnormal spots. Counseling Your health care provider may ask you questions about your:  Alcohol, tobacco, and drug use.  Emotional well-being.  Home and relationship well-being.  Sexual activity.  Eating habits.  Work and work Statistician.  Method of birth control.  Menstrual cycle.  Pregnancy history. What immunizations do I need?  Influenza (flu) vaccine  This is recommended every year. Tetanus, diphtheria, and pertussis (Tdap) vaccine  You may need a Td booster every 10  years. Varicella (chickenpox) vaccine  You may need this if you have not been vaccinated. Zoster (shingles) vaccine  You may need this after age 18. Measles, mumps, and rubella (MMR) vaccine  You may need at least one dose of MMR if you were born in 1957 or later. You may also need a second dose. Pneumococcal conjugate (PCV13) vaccine  You may need this if you have certain conditions and were not previously vaccinated. Pneumococcal polysaccharide (PPSV23) vaccine  You may need one or two doses if you smoke cigarettes or if you have certain conditions. Meningococcal conjugate (MenACWY) vaccine  You may need this if you have certain conditions. Hepatitis A vaccine  You may need this if you have certain conditions or if you travel or work in places where you may be exposed to hepatitis A. Hepatitis B vaccine  You may need this if you have certain conditions or if you travel or work in places where you may be exposed to hepatitis B. Haemophilus influenzae type b (Hib) vaccine  You may need this if you have certain conditions. Human papillomavirus (HPV) vaccine  If recommended by your health care provider, you may need three doses over 6 months. You may receive vaccines as individual doses or as more than one vaccine together in one shot (combination vaccines). Talk with your health care provider about the risks and benefits of combination vaccines. What tests do I need? Blood tests  Lipid and cholesterol levels. These may be checked every 5 years, or more frequently if you are over 44 years old.  Hepatitis C test.  Hepatitis B test. Screening  Lung cancer screening. You may have this screening every year starting at age 62 if you have a 30-pack-year history of smoking and currently smoke or have quit within the past 15 years.  Colorectal cancer screening. All adults should have this screening starting at age 67 and continuing until age 32. Your health care provider may  recommend screening at age 81 if you are at increased risk. You will have tests every 1-10 years, depending on your results and the type of screening test.  Diabetes screening. This is done by checking your blood sugar (glucose) after you have not eaten for a while (fasting). You may have this done every 1-3 years.  Mammogram. This may be done every 1-2 years. Talk with your health care provider about when you should start having regular mammograms. This may depend on whether you have a family history of breast cancer.  BRCA-related cancer screening. This may be done if you have a family history of breast, ovarian, tubal, or peritoneal cancers.  Pelvic exam and Pap test. This may be done every 3 years starting at age 109. Starting at age 88, this may be done every 5 years if you have a Pap test in combination with an HPV test. Other tests  Sexually transmitted disease (STD) testing.  Bone density scan. This is done to screen for osteoporosis. You may have this scan if you are at high risk for osteoporosis. Follow these instructions at home: Eating and drinking  Eat a diet that includes fresh fruits and vegetables, whole grains, lean protein, and low-fat dairy.  Take vitamin and mineral supplements as recommended by your health care provider.  Do not drink alcohol if: ? Your health care provider tells you not to drink. ? You are pregnant, may be pregnant, or are planning to become pregnant.  If you drink alcohol: ? Limit how much you have to 0-1 drink a day. ? Be aware of how much alcohol is in your drink. In the [place] one drink equals one 12 oz bottle of beer (355 mL), one 5 oz glass of wine (148 mL), or one 1 oz glass of hard liquor (44 mL). Lifestyle  Take daily care of your teeth and gums.  Stay active. Exercise for at least 30 minutes on 5 or more days each week.  Do not use any products that contain nicotine or tobacco, such as cigarettes, e-cigarettes, and chewing tobacco. If  you need help quitting, ask your health care provider.  If you are sexually active, practice safe sex. Use a condom or other form of birth control (contraception) in order to prevent pregnancy and STIs (sexually transmitted infections).  If told by your health care provider, take low-dose aspirin daily starting at age 68. What's next?  Visit your health care provider once a year for a well check visit.  Ask your health care provider how often you should have your eyes and teeth checked.  Stay up to date on all vaccines. This information is not intended to replace advice given to you by your health care provider. Make sure you discuss any questions you have with your health care provider. Document Revised: 05/28/2018 Document Reviewed: 05/28/2018 Elsevier Patient Education  2020 [employer].

## 2021-05-09 DIAGNOSIS — R112 Nausea with vomiting, unspecified: Secondary | ICD-10-CM | POA: Diagnosis not present

## 2021-05-09 DIAGNOSIS — U071 COVID-19: Secondary | ICD-10-CM | POA: Diagnosis not present

## 2021-05-24 ENCOUNTER — Other Ambulatory Visit: Payer: Self-pay | Admitting: Primary Care

## 2021-05-24 DIAGNOSIS — F3342 Major depressive disorder, recurrent, in full remission: Secondary | ICD-10-CM

## 2021-05-25 ENCOUNTER — Other Ambulatory Visit: Payer: Self-pay | Admitting: Primary Care

## 2021-05-25 DIAGNOSIS — F3342 Major depressive disorder, recurrent, in full remission: Secondary | ICD-10-CM

## 2021-06-29 ENCOUNTER — Encounter: Payer: Self-pay | Admitting: Primary Care

## 2021-06-29 ENCOUNTER — Telehealth (INDEPENDENT_AMBULATORY_CARE_PROVIDER_SITE_OTHER): Payer: BC Managed Care – PPO | Admitting: Primary Care

## 2021-06-29 VITALS — Temp 98.6°F | Ht 61.0 in | Wt 157.0 lb

## 2021-06-29 DIAGNOSIS — F3342 Major depressive disorder, recurrent, in full remission: Secondary | ICD-10-CM | POA: Diagnosis not present

## 2021-06-29 DIAGNOSIS — Z1231 Encounter for screening mammogram for malignant neoplasm of breast: Secondary | ICD-10-CM

## 2021-06-29 DIAGNOSIS — E785 Hyperlipidemia, unspecified: Secondary | ICD-10-CM | POA: Diagnosis not present

## 2021-06-29 MED ORDER — SERTRALINE HCL 100 MG PO TABS: ORAL_TABLET | ORAL | 3 refills | Status: DC

## 2021-06-29 NOTE — Progress Notes (Signed)
Patient ID: Alexandra Delgado, female    DOB: [birth year], 55 y.o.   MRN: [id number]  Virtual visit completed through [employer], a video enabled telemedicine application. Due to national recommendations of social distancing due to COVID-19, a virtual visit is felt to be most appropriate for this patient at this time. Reviewed limitations, risks, security and privacy concerns of performing a virtual visit and the availability of in person appointments. I also reviewed that there may be a patient responsible charge related to this service. The patient agreed to proceed.   Patient location: home Provider location: [place] [facility], office Persons participating in this virtual visit: patient, provider   If any vitals were documented, they were collected by patient at home unless specified below.    Temp 98.6 F (37 C) (Temporal)   Ht 5\' 1"  (1.549 m)   Wt 157 lb (71.2 kg)   LMP 09/30/2009   BMI 29.66 kg/m    CC: Follow up and medication refill Subjective:   HPI: Alexandra Delgado is a 55 y.o. female presenting on 06/29/2021 for follow up of depression and medication refill. She is also due for a mammogram.   Currently managed on sertraline 100 mg for depression. She has been taking this for years. Today she endorses doing well, is needing refills.   History of hyperlipidemia, LDL of 129 in 2021, improved from 145 in 2018. She is working on her diet, tries to make healthy choices.       Relevant past medical, surgical, family and social history reviewed and updated as indicated. Interim medical history since our last visit reviewed. Allergies and medications reviewed and updated. Outpatient Medications Prior to Visit  Medication Sig Dispense Refill   sertraline (ZOLOFT) 100 MG tablet TAKE 1 TABLET BY MOUTH EVERY DAY for depression. 30 tablet 0   No facility-administered medications prior to visit.     Per HPI unless specifically indicated in ROS section below Review of  Systems  Respiratory:  Negative for shortness of breath.   Cardiovascular:  Negative for chest pain.  Psychiatric/Behavioral:  The patient is not nervous/anxious.   Objective:  Temp 98.6 F (37 C) (Temporal)   Ht 5\' 1"  (1.549 m)   Wt 157 lb (71.2 kg)   LMP 09/30/2009   BMI 29.66 kg/m   Wt Readings from Last 3 Encounters:  06/29/21 157 lb (71.2 kg)  06/01/20 157 lb (71.2 kg)  03/04/19 139 lb 8 oz (63.3 kg)       Physical exam: Gen: alert, NAD, not ill appearing Pulm: speaks in complete sentences without increased work of breathing Psych: normal mood, normal thought content      Results for orders placed or performed in visit on 06/01/20  Lipid panel  Result Value Ref Range   Cholesterol 212 (H) 0 - 200 mg/dL   Triglycerides 05/04/19 0.0 - 149.0 mg/dL   HDL 08/01/20 836.6 mg/dL   VLDL 29.47 0.0 - >65.46 mg/dL   LDL Cholesterol 50.3 (H) 0 - 99 mg/dL   Total CHOL/HDL Ratio 3    NonHDL 150.76   Comprehensive metabolic panel  Result Value Ref Range   Sodium 140 135 - 145 mEq/L   Potassium 4.2 3.5 - 5.1 mEq/L   Chloride 105 96 - 112 mEq/L   CO2 29 19 - 32 mEq/L   Glucose, Bld 83 70 - 99 mg/dL   BUN 11 6 - 23 mg/dL   Creatinine, Ser 54.6 0.40 - 1.20 mg/dL  Total Bilirubin 0.5 0.2 - 1.2 mg/dL   Alkaline Phosphatase 99 39 - 117 U/L   AST 16 0 - 37 U/L   ALT 18 0 - 35 U/L   Total Protein 6.8 6.0 - 8.3 g/dL   Albumin 4.4 3.5 - 5.2 g/dL   GFR 82.95 (L) >62.13 mL/min   Calcium 9.3 8.4 - 10.5 mg/dL  Hemoglobin Y8M  Result Value Ref Range   Hgb A1c MFr Bld 5.2 4.6 - 6.5 %  CBC  Result Value Ref Range   WBC 5.3 4.0 - 10.5 K/uL   RBC 4.65 3.87 - 5.11 Mil/uL   Platelets 273.0 150.0 - 400.0 K/uL   Hemoglobin 14.0 12.0 - 15.0 g/dL   HCT 57.8 46.9 - 62.9 %   MCV 87.9 78.0 - 100.0 fl   MCHC 34.3 30.0 - 36.0 g/dL   RDW 52.8 41.3 - 24.4 %   Assessment & Plan:   Problem List Items Addressed This Visit       Other   MDD (major depressive disorder)    Well controlled on  sertraline 100 mg, continue same.  Refills sent to pharmacy.      Relevant Medications   sertraline (ZOLOFT) 100 MG tablet   Hyperlipidemia    LDL improved in 2021 compared to 2018.  She kindly declines labs this year, agrees to repeat in 2023.  Discussed the importance of a healthy diet and regular exercise in order for weight loss, and to reduce the risk of further co-morbidity.       Other Visit Diagnoses     Encounter for screening mammogram for malignant neoplasm of breast    -  Primary   Relevant Orders   MM 3D SCREEN BREAST BILATERAL        Meds ordered this encounter  Medications   sertraline (ZOLOFT) 100 MG tablet    Sig: TAKE 1 TABLET BY MOUTH EVERY DAY for depression.    Dispense:  90 tablet    Refill:  3    Order Specific Question:   Supervising Provider    Answer:   Ermalene Searing, AMY E [[id number]]   Orders Placed This Encounter  Procedures   MM 3D SCREEN BREAST BILATERAL    Standing Status:   Future    Standing Expiration Date:   06/29/2022    Order Specific Question:   Reason for Exam (SYMPTOM  OR DIAGNOSIS REQUIRED)    Answer:   screening for breast cancer    Order Specific Question:   Preferred imaging location?    Answer:   [facility]    Order Specific Question:   Is the patient pregnant?    Answer:   No    I discussed the assessment and treatment plan with the patient. The patient was provided an opportunity to ask questions and all were answered. The patient agreed with the plan and demonstrated an understanding of the instructions. The patient was advised to call back or seek an in-person evaluation if the symptoms worsen or if the condition fails to improve as anticipated.  Follow up plan:  Call the Breast Center to schedule your mammogram.   Continue to work on your diet.  It was a pleasure to see you today!   Doreene Nest, NP

## 2021-06-29 NOTE — Assessment & Plan Note (Signed)
LDL improved in 2021 compared to 2018.  She kindly declines labs this year, agrees to repeat in 2023.  Discussed the importance of a healthy diet and regular exercise in order for weight loss, and to reduce the risk of further co-morbidity.

## 2021-06-29 NOTE — Assessment & Plan Note (Signed)
Well controlled on sertraline 100 mg, continue same.  Refills sent to pharmacy.

## 2021-07-01 ENCOUNTER — Other Ambulatory Visit: Payer: Self-pay | Admitting: Primary Care

## 2021-07-01 DIAGNOSIS — F3342 Major depressive disorder, recurrent, in full remission: Secondary | ICD-10-CM

## 2021-07-02 ENCOUNTER — Other Ambulatory Visit: Payer: Self-pay | Admitting: Primary Care

## 2021-07-02 DIAGNOSIS — F3342 Major depressive disorder, recurrent, in full remission: Secondary | ICD-10-CM

## 2021-07-03 ENCOUNTER — Telehealth: Payer: Self-pay | Admitting: Primary Care

## 2021-07-03 NOTE — Telephone Encounter (Signed)
I have received numerous requests for sertraline 100 mg, however I sent a 1 year supply on 06/29/2021.  Find out what is going on please.

## 2021-07-03 NOTE — Telephone Encounter (Signed)
  Encourage patient to contact the pharmacy for refills or they can request refills through [facility]  LAST APPOINTMENT DATE:  Please schedule appointment if longer than 1 year  NEXT APPOINTMENT DATE:no future appt  MEDICATION:sertraline (ZOLOFT) 100 MG tablet  Is the patient out of medication? 2 days left  PHARMACY:[id number] - [place], [place] - [address]  Let patient know to contact pharmacy at the end of the day to make sure medication is ready.  Please notify patient to allow 48-72 hours to process  CLINICAL FILLS OUT ALL BELOW:   LAST REFILL:  QTY:  REFILL DATE:    OTHER COMMENTS:    Okay for refill?  Please advise

## 2021-07-03 NOTE — Telephone Encounter (Signed)
Called l/m to call office to what is going on with refill. I have called pharmacy to verify there but was on hold for over 15 minutes will call back tomorrow.

## 2021-07-04 NOTE — Telephone Encounter (Signed)
Patient called back states that pharmacy informed that they do not have script. And that who ever she spoke to yesterday when she called told her that we discontinued the prescription. Apologized for the confusion but let her know that unfortunately reception can not see every thing in the chart. They only have access to some parts. Assured her that the refill was called in and that I would call pharmacy to figure out what we need to do to ger her medications as soon as possible. Patient ok with response being sent in my chart message unless more questions or information needed.   I have call pharmacy and was on hold for another 17 minutes. Finally was able to speak to pharmacist. States that script was never received. Verified that it was the correct pharmacy on script in our system. It did show on our end that it was received. I have given verbal script based on the on that was sent at office visit on 9/30. He confirmed and verified script. Will get ready for patient pick up now.   I have sent message to patient per request in my chart that it will be ready for pick up today and to let me now if any issues.

## 2021-07-04 NOTE — Telephone Encounter (Signed)
Noted and appreciate the follow up. 

## 2021-07-16 DIAGNOSIS — Z1211 Encounter for screening for malignant neoplasm of colon: Secondary | ICD-10-CM

## 2021-08-02 ENCOUNTER — Ambulatory Visit
Admission: RE | Admit: 2021-08-02 | Discharge: 2021-08-02 | Disposition: A | Discharge status: Home / Self Care | Payer: BC Managed Care – PPO | Source: Ambulatory Visit | Attending: Primary Care | Admitting: Primary Care

## 2021-08-02 ENCOUNTER — Other Ambulatory Visit: Payer: Self-pay

## 2021-08-02 DIAGNOSIS — Z1231 Encounter for screening mammogram for malignant neoplasm of breast: Secondary | ICD-10-CM | POA: Diagnosis not present

## 2021-08-05 DIAGNOSIS — Z1211 Encounter for screening for malignant neoplasm of colon: Secondary | ICD-10-CM | POA: Diagnosis not present

## 2021-08-11 LAB — COLOGUARD: COLOGUARD: NEGATIVE

## 2021-08-13 DIAGNOSIS — K0889 Other specified disorders of teeth and supporting structures: Secondary | ICD-10-CM | POA: Diagnosis not present

## 2021-08-13 DIAGNOSIS — Z972 Presence of dental prosthetic device (complete) (partial): Secondary | ICD-10-CM | POA: Diagnosis not present

## 2022-06-28 ENCOUNTER — Telehealth: Payer: Self-pay | Admitting: Primary Care

## 2022-06-28 DIAGNOSIS — F3342 Major depressive disorder, recurrent, in full remission: Secondary | ICD-10-CM

## 2022-06-28 NOTE — Telephone Encounter (Signed)
Called pt, no answer, left vm to call back 

## 2022-06-28 NOTE — Telephone Encounter (Signed)
Patient is overdue for CPE/follow up, this will be required prior to any further refills. This must be an IN person visit. Not virtual.  Please schedule, thanks!

## 2022-06-28 NOTE — Telephone Encounter (Signed)
Patient advised. Scheduled for CPE on 10/10

## 2022-07-09 ENCOUNTER — Encounter: Payer: Self-pay | Admitting: Primary Care

## 2022-07-09 ENCOUNTER — Ambulatory Visit (INDEPENDENT_AMBULATORY_CARE_PROVIDER_SITE_OTHER): Payer: BC Managed Care – PPO | Admitting: Primary Care

## 2022-07-09 VITALS — BP 134/80 | HR 75 | Temp 97.5°F | Ht 61.0 in | Wt 162.0 lb

## 2022-07-09 DIAGNOSIS — K0889 Other specified disorders of teeth and supporting structures: Secondary | ICD-10-CM

## 2022-07-09 DIAGNOSIS — E785 Hyperlipidemia, unspecified: Secondary | ICD-10-CM

## 2022-07-09 DIAGNOSIS — Z114 Encounter for screening for human immunodeficiency virus [HIV]: Secondary | ICD-10-CM

## 2022-07-09 DIAGNOSIS — Z0001 Encounter for general adult medical examination with abnormal findings: Secondary | ICD-10-CM

## 2022-07-09 DIAGNOSIS — Z1159 Encounter for screening for other viral diseases: Secondary | ICD-10-CM

## 2022-07-09 DIAGNOSIS — F3342 Major depressive disorder, recurrent, in full remission: Secondary | ICD-10-CM | POA: Diagnosis not present

## 2022-07-09 MED ORDER — AMOXICILLIN-POT CLAVULANATE 875-125 MG PO TABS: 1.0000 | ORAL_TABLET | Freq: Two times a day (BID) | ORAL | 0 refills | Status: DC

## 2022-07-09 NOTE — Patient Instructions (Signed)
Stop by the lab prior to leaving today. I will notify you of your results once received.   Start Augmentin antibiotics for the infection Take 1 tablet by mouth twice daily for 7 days.  It was a pleasure to see you today!  Preventive Care 7-57 Years Old, Female Preventive care refers to lifestyle choices and visits with your health care provider that can promote health and wellness. Preventive care visits are also called wellness exams. What can I expect for my preventive care visit? Counseling Your health care provider may ask you questions about your: Medical history, including: Past medical problems. Family medical history. Pregnancy history. Current health, including: Menstrual cycle. Method of birth control. Emotional well-being. Home life and relationship well-being. Sexual activity and sexual health. Lifestyle, including: Alcohol, nicotine or tobacco, and drug use. Access to firearms. Diet, exercise, and sleep habits. Work and work Statistician. Sunscreen use. Safety issues such as seatbelt and bike helmet use. Physical exam Your health care provider will check your: Height and weight. These may be used to calculate your BMI (body mass index). BMI is a measurement that tells if you are at a healthy weight. Waist circumference. This measures the distance around your waistline. This measurement also tells if you are at a healthy weight and may help predict your risk of certain diseases, such as type 2 diabetes and high blood pressure. Heart rate and blood pressure. Body temperature. Skin for abnormal spots. What immunizations do I need?  Vaccines are usually given at various ages, according to a schedule. Your health care provider will recommend vaccines for you based on your age, medical history, and lifestyle or other factors, such as travel or where you work. What tests do I need? Screening Your health care provider may recommend screening tests for certain conditions.  This may include: Lipid and cholesterol levels. Diabetes screening. This is done by checking your blood sugar (glucose) after you have not eaten for a while (fasting). Pelvic exam and Pap test. Hepatitis B test. Hepatitis C test. HIV (human immunodeficiency virus) test. STI (sexually transmitted infection) testing, if you are at risk. Lung cancer screening. Colorectal cancer screening. Mammogram. Talk with your health care provider about when you should start having regular mammograms. This may depend on whether you have a family history of breast cancer. BRCA-related cancer screening. This may be done if you have a family history of breast, ovarian, tubal, or peritoneal cancers. Bone density scan. This is done to screen for osteoporosis. Talk with your health care provider about your test results, treatment options, and if necessary, the need for more tests. Follow these instructions at home: Eating and drinking  Eat a diet that includes fresh fruits and vegetables, whole grains, lean protein, and low-fat dairy products. Take vitamin and mineral supplements as recommended by your health care provider. Do not drink alcohol if: Your health care provider tells you not to drink. You are pregnant, may be pregnant, or are planning to become pregnant. If you drink alcohol: Limit how much you have to 0-1 drink a day. Know how much alcohol is in your drink. In the [place] one drink equals one 12 oz bottle of beer (355 mL), one 5 oz glass of wine (148 mL), or one 1 oz glass of hard liquor (44 mL). Lifestyle Brush your teeth every morning and night with fluoride toothpaste. Floss one time each day. Exercise for at least 30 minutes 5 or more days each week. Do not use any products that contain nicotine  or tobacco. These products include cigarettes, chewing tobacco, and vaping devices, such as e-cigarettes. If you need help quitting, ask your health care provider. Do not use drugs. If you are  sexually active, practice safe sex. Use a condom or other form of protection to prevent STIs. If you do not wish to become pregnant, use a form of birth control. If you plan to become pregnant, see your health care provider for a prepregnancy visit. Take aspirin only as told by your health care provider. Make sure that you understand how much to take and what form to take. Work with your health care provider to find out whether it is safe and beneficial for you to take aspirin daily. Find healthy ways to manage stress, such as: Meditation, yoga, or listening to music. Journaling. Talking to a trusted person. Spending time with friends and family. Minimize exposure to UV radiation to reduce your risk of skin cancer. Safety Always wear your seat belt while driving or riding in a vehicle. Do not drive: If you have been drinking alcohol. Do not ride with someone who has been drinking. When you are tired or distracted. While texting. If you have been using any mind-altering substances or drugs. Wear a helmet and other protective equipment during sports activities. If you have firearms in your house, make sure you follow all gun safety procedures. Seek help if you have been physically or sexually abused. What's next? Visit your health care provider once a year for an annual wellness visit. Ask your health care provider how often you should have your eyes and teeth checked. Stay up to date on all vaccines. This information is not intended to replace advice given to you by your health care provider. Make sure you discuss any questions you have with your health care provider. Document Revised: 03/14/2021 Document Reviewed: 03/14/2021 Elsevier Patient Education  [place].

## 2022-07-09 NOTE — Assessment & Plan Note (Signed)
Repeat lipid panel pending.  Discussed the importance of a healthy diet and regular exercise in order for weight loss, and to reduce the risk of further co-morbidity.  

## 2022-07-09 NOTE — Assessment & Plan Note (Signed)
Controlled.  Continue Zoloft 100 mg daily 

## 2022-07-09 NOTE — Assessment & Plan Note (Signed)
Acute on chronic. Discussed the need to find a local dentist, she agrees and will schedule.  No obvious complication noted on exam.  Start Augmentin BID x 7 days.

## 2022-07-09 NOTE — Assessment & Plan Note (Signed)
Declines Shingrix and influenza vaccines. Pap smear overdue. Declines pap smear despite recommendations. She would like to defer mammogram until 2024. Colon cancer screening UTD, due 2025.  Discussed the importance of a healthy diet and regular exercise in order for weight loss, and to reduce the risk of further co-morbidity.  Exam today as noted. Labs pending.

## 2022-07-09 NOTE — Progress Notes (Signed)
Subjective:    Patient ID: Alexandra Delgado, female    DOB: 21-Nov-1965, 56 y.o.   MRN: [id number]  HPI  Alexandra Delgado is a very pleasant 56 y.o. female who presents today for complete physical and follow up of chronic conditions.  She would also like to discuss dental pain. Her pain is located to the lower left molar region which began about four days ago. Her pain is actually chronic which flares up infrequently. Historically, she is treated with oral antibiotics and her symptoms will abate. She has a bridge to the left lower molar region which has been in place for 15 years. She needs a root canal and is working to find a Radiation protection practitioner. She denies fevers. She has noticed pain radiating from her left lower molar region to her left ear.   Immunizations: -Tetanus: 2018 -Influenza: Declines  -Shingles: Never completed, declines   Diet: Fair diet.  Exercise: No regular exercise.  Eye exam: Completed years ago  Dental exam: Completed years ago   Pap Smear: Completed in 2014, declined last year and prior years. Declines now.  Mammogram: Completed in November 2022  Colonoscopy: Completed Cologuard in 2022, negative  BP Readings from Last 3 Encounters:  07/09/22 134/80  06/01/20 112/76  12/26/17 118/78        Review of Systems  Constitutional:  Negative for unexpected weight change.  HENT:  Positive for dental problem. Negative for rhinorrhea.   Eyes:  Positive for visual disturbance.  Respiratory:  Negative for cough and shortness of breath.   Cardiovascular:  Negative for chest pain.  Gastrointestinal:  Negative for constipation and diarrhea.  Genitourinary:  Negative for difficulty urinating.  Musculoskeletal:  Negative for arthralgias and myalgias.  Skin:  Negative for rash.  Allergic/Immunologic: Negative for environmental allergies.  Neurological:  Negative for dizziness and headaches.  Psychiatric/Behavioral:  The patient is not nervous/anxious.           Past Medical History:  Diagnosis Date   Depression    Herpes zoster 06/17/2014    Social History   Socioeconomic History   Marital status: Divorced    Spouse name: Not on file   Number of children: 0   Years of education: Not on file   Highest education level: Not on file  Occupational History    Employer: [employer]  Tobacco Use   Smoking status: Never   Smokeless tobacco: Never  Substance and Sexual Activity   Alcohol use: No    Alcohol/week: 0.0 standard drinks of alcohol   Drug use: No   Sexual activity: Not on file  Other Topics Concern   Not on file  Social History Narrative   Divorced.   Works as Environmental health practitioner.   No children.   Enjoys spending time with her dogs, hiking.   Social Determinants of Health   Financial Resource Strain: Not on file  Food Insecurity: Not on file  Transportation Needs: Not on file  Physical Activity: Not on file  Stress: Not on file  Social Connections: Not on file  Intimate Partner Violence: Not on file    Past Surgical History:  Procedure Laterality Date   NO PAST SURGERIES      Family History  Problem Relation Age of Onset   Arthritis Mother    Hyperlipidemia Mother    Hypertension Mother    Hyperlipidemia Father    Heart disease Father    Hypertension Father    Kidney disease Father    Diabetes Father  Breast cancer Maternal Aunt        x2    Allergies  Allergen Reactions   Erythromycin Other (See Comments)    GI upset GI upset    Current Outpatient Medications on File Prior to Visit  Medication Sig Dispense Refill   sertraline (ZOLOFT) 100 MG tablet TAKE 1 TABLET BY MOUTH EVERY DAY for depression. Office visit required for further refills. 30 tablet 0   No current facility-administered medications on file prior to visit.    BP 134/80   Pulse 75   Temp (!) 97.5 F (36.4 C) (Temporal)   Ht 5\' 1"  (1.549 m)   Wt 162 lb (73.5 kg)   LMP 09/30/2009   SpO2 99%   BMI 30.61 kg/m  Objective:    Physical Exam HENT:     Right Ear: Tympanic membrane and ear canal normal.     Left Ear: Tympanic membrane and ear canal normal.     Nose: Nose normal.     Mouth/Throat:     Mouth: Mucous membranes are moist. No oral lesions.     Dentition: Normal dentition. No dental tenderness, gingival swelling, dental caries or dental abscesses.     Tongue: No lesions.  Eyes:     Conjunctiva/sclera: Conjunctivae normal.     Pupils: Pupils are equal, round, and reactive to light.  Neck:     Thyroid: No thyromegaly.  Cardiovascular:     Rate and Rhythm: Normal rate and regular rhythm.     Heart sounds: No murmur heard. Pulmonary:     Effort: Pulmonary effort is normal.     Breath sounds: Normal breath sounds. No rales.  Abdominal:     General: Bowel sounds are normal.     Palpations: Abdomen is soft.     Tenderness: There is no abdominal tenderness.  Musculoskeletal:        General: Normal range of motion.     Cervical back: Neck supple.  Lymphadenopathy:     Cervical: No cervical adenopathy.  Skin:    General: Skin is warm and dry.     Findings: No rash.  Neurological:     Mental Status: She is alert and oriented to person, place, and time.     Cranial Nerves: No cranial nerve deficit.     Deep Tendon Reflexes: Reflexes are normal and symmetric.           Assessment & Plan:   Problem List Items Addressed This Visit       Other   MDD (major depressive disorder)    Controlled.  Continue Zoloft 100 mg daily.       Encounter for annual general medical examination with abnormal findings in adult    Declines Shingrix and influenza vaccines. Pap smear overdue. Declines pap smear despite recommendations. She would like to defer mammogram until 2024. Colon cancer screening UTD, due 2025.  Discussed the importance of a healthy diet and regular exercise in order for weight loss, and to reduce the risk of further co-morbidity.  Exam today as noted. Labs pending.       Hyperlipidemia    Repeat lipid panel pending.   Discussed the importance of a healthy diet and regular exercise in order for weight loss, and to reduce the risk of further co-morbidity.       Relevant Orders   Lipid panel   Hemoglobin A1c   Comprehensive metabolic panel   CBC   Pain, dental    Acute on chronic. Discussed the  need to find a local dentist, she agrees and will schedule.  No obvious complication noted on exam.  Start Augmentin BID x 7 days.       Relevant Medications   amoxicillin-clavulanate (AUGMENTIN) 875-125 MG tablet   Other Visit Diagnoses     Screening for HIV (human immunodeficiency virus)    -  Primary   Relevant Orders   HIV Antibody (routine testing w rflx)   Encounter for hepatitis C screening test for low risk patient       Relevant Orders   Hepatitis C Antibody          Doreene Nest, NP

## 2022-07-10 LAB — LIPID PANEL
Cholesterol: 245 mg/dL — ABNORMAL HIGH (ref 0–200)
HDL: 58.8 mg/dL (ref >39.00)
LDL Cholesterol: 146 mg/dL — ABNORMAL HIGH (ref 0–99)
NonHDL: 185.85
Total CHOL/HDL Ratio: 4
Triglycerides: 198 mg/dL — ABNORMAL HIGH (ref 0.0–149.0)
VLDL: 39.6 mg/dL (ref 0.0–40.0)

## 2022-07-10 LAB — CBC
HCT: 39.8 % (ref 36.0–46.0)
Hemoglobin: 13.4 g/dL (ref 12.0–15.0)
MCHC: 33.7 g/dL (ref 30.0–36.0)
MCV: 86.7 fl (ref 78.0–100.0)
Platelets: 236 10*3/uL (ref 150.0–400.0)
RBC: 4.59 Mil/uL (ref 3.87–5.11)
RDW: 13.8 % (ref 11.5–15.5)
WBC: 5.2 10*3/uL (ref 4.0–10.5)

## 2022-07-10 LAB — HEPATITIS C ANTIBODY: Hepatitis C Ab: NONREACTIVE

## 2022-07-10 LAB — COMPREHENSIVE METABOLIC PANEL
ALT: 12 U/L (ref 0–35)
AST: 13 U/L (ref 0–37)
Albumin: 4.4 g/dL (ref 3.5–5.2)
Alkaline Phosphatase: 84 U/L (ref 39–117)
BUN: 12 mg/dL (ref 6–23)
CO2: 27 mEq/L (ref 19–32)
Calcium: 9.5 mg/dL (ref 8.4–10.5)
Chloride: 108 mEq/L (ref 96–112)
Creatinine, Ser: 0.92 mg/dL (ref 0.40–1.20)
GFR: 69.12 mL/min (ref >60.00)
Glucose, Bld: 79 mg/dL (ref 70–99)
Potassium: 3.9 mEq/L (ref 3.5–5.1)
Sodium: 143 mEq/L (ref 135–145)
Total Bilirubin: 0.5 mg/dL (ref 0.2–1.2)
Total Protein: 6.8 g/dL (ref 6.0–8.3)

## 2022-07-10 LAB — HEMOGLOBIN A1C: Hgb A1c MFr Bld: 5.2 % (ref 4.6–6.5)

## 2022-07-10 LAB — HIV ANTIBODY (ROUTINE TESTING W REFLEX): HIV 1&2 Ab, 4th Generation: NONREACTIVE

## 2022-07-28 ENCOUNTER — Other Ambulatory Visit: Payer: Self-pay | Admitting: Primary Care

## 2022-07-28 DIAGNOSIS — F3342 Major depressive disorder, recurrent, in full remission: Secondary | ICD-10-CM

## 2023-02-18 ENCOUNTER — Other Ambulatory Visit: Payer: Self-pay | Admitting: Primary Care

## 2023-02-18 DIAGNOSIS — Z1231 Encounter for screening mammogram for malignant neoplasm of breast: Secondary | ICD-10-CM

## 2023-02-20 ENCOUNTER — Ambulatory Visit
Admission: RE | Admit: 2023-02-20 | Discharge: 2023-02-20 | Disposition: A | Discharge status: Home / Self Care | Payer: BC Managed Care – PPO | Source: Ambulatory Visit | Attending: Primary Care | Admitting: Primary Care

## 2023-02-20 DIAGNOSIS — Z1231 Encounter for screening mammogram for malignant neoplasm of breast: Secondary | ICD-10-CM

## 2023-08-06 ENCOUNTER — Other Ambulatory Visit: Payer: Self-pay | Admitting: Primary Care

## 2023-08-06 DIAGNOSIS — F3342 Major depressive disorder, recurrent, in full remission: Secondary | ICD-10-CM

## 2023-08-06 NOTE — Telephone Encounter (Signed)
Patient is due for CPE/follow up, this will be required prior to any further refills.  Please schedule, thank you!   

## 2023-08-06 NOTE — Telephone Encounter (Signed)
Lvmtcb, sent mychart message  

## 2023-08-30 ENCOUNTER — Other Ambulatory Visit: Payer: Self-pay | Admitting: Primary Care

## 2023-08-30 DIAGNOSIS — F3342 Major depressive disorder, recurrent, in full remission: Secondary | ICD-10-CM

## 2024-05-07 ENCOUNTER — Other Ambulatory Visit: Payer: Self-pay | Admitting: Primary Care

## 2024-05-07 DIAGNOSIS — Z1231 Encounter for screening mammogram for malignant neoplasm of breast: Secondary | ICD-10-CM

## 2024-05-12 ENCOUNTER — Ambulatory Visit (INDEPENDENT_AMBULATORY_CARE_PROVIDER_SITE_OTHER): Admitting: Primary Care

## 2024-05-12 ENCOUNTER — Encounter: Payer: Self-pay | Admitting: Primary Care

## 2024-05-12 VITALS — BP 126/66 | HR 72 | Temp 97.5°F | Ht 61.0 in | Wt 164.6 lb

## 2024-05-12 DIAGNOSIS — N6310 Unspecified lump in the right breast, unspecified quadrant: Secondary | ICD-10-CM | POA: Insufficient documentation

## 2024-05-12 NOTE — Assessment & Plan Note (Signed)
 Interesting presentation today.  The spot of concern is partially on the breast and partially on her chest wall. Given family history, coupled with location, will obtain diagnostic mammogram and bilateral breast ultrasounds. Orders placed.

## 2024-05-12 NOTE — Progress Notes (Signed)
 Subjective:    Patient ID: Alexandra Delgado, female    DOB: 1966-02-07, 58 y.o.   MRN: [id number]  HPI  Alexandra Delgado is a very pleasant 58 y.o. female with a history of hyperlipidemia who presents today to discuss breast mass.  About 2 weeks ago she noticed a firm pea sized mass to the right medial breast/chest wall.  She attempted to schedule her screening mammogram and was told by the breast center she needed evaluation.    She denies pain, changes to skin texture, changes in size.  She has been pressing on the site which has caused erythema.  She has a family history of breast cancer in her maternal aunt.  Her last mammogram was in May 2024, category C dense breasts, BI-RADS 1.     Review of Systems  Genitourinary:        Right breast/chest wall mass  Skin:  Positive for color change.       Skin mass         Past Medical History:  Diagnosis Date   Depression    Herpes zoster 06/17/2014    Social History   Socioeconomic History   Marital status: Divorced    Spouse name: Not on file   Number of children: 0   Years of education: Not on file   Highest education level: Some college, no degree  Occupational History    Employer: [employer]  Tobacco Use   Smoking status: Never   Smokeless tobacco: Never  Substance and Sexual Activity   Alcohol use: No    Alcohol/week: 0.0 standard drinks of alcohol   Drug use: No   Sexual activity: Not on file  Other Topics Concern   Not on file  Social History Narrative   Divorced.   Works as Environmental health practitioner.   No children.   Enjoys spending time with her dogs, hiking.   Social Drivers of Corporate investment banker Strain: Low Risk  (05/11/2024)   Overall Financial Resource Strain (CARDIA)    Difficulty of Paying Living Expenses: Not very hard  Food Insecurity: No Food Insecurity (05/11/2024)   Hunger Vital Sign    Worried About Running Out of Food in the Last Year: Never true    Ran Out of Food in the Last  Year: Never true  Transportation Needs: No Transportation Needs (05/11/2024)   PRAPARE - Administrator, Civil Service (Medical): No    Lack of Transportation (Non-Medical): No  Physical Activity: Inactive (05/11/2024)   Exercise Vital Sign    Days of Exercise per Week: 0 days    Minutes of Exercise per Session: Not on file  Stress: No Stress Concern Present (05/11/2024)   [employer] of Occupational Health - Occupational Stress Questionnaire    Feeling of Stress: Only a little  Social Connections: Socially Isolated (05/11/2024)   Social Connection and Isolation Panel    Frequency of Communication with Friends and Family: More than three times a week    Frequency of Social Gatherings with Friends and Family: Once a week    Attends Religious Services: Patient declined    Database administrator or Organizations: No    Attends Engineer, structural: Not on file    Marital Status: Divorced  Intimate Partner Violence: Not on file    Past Surgical History:  Procedure Laterality Date   NO PAST SURGERIES      Family History  Problem Relation Age of Onset  Arthritis Mother    Hyperlipidemia Mother    Hypertension Mother    Hyperlipidemia Father    Heart disease Father    Hypertension Father    Kidney disease Father    Diabetes Father    Breast cancer Maternal Aunt        x2    Allergies  Allergen Reactions   Erythromycin Other (See Comments)    GI upset GI upset    Current Outpatient Medications on File Prior to Visit  Medication Sig Dispense Refill   Multiple Vitamins-Minerals (PRESERVISION/LUTEIN) CAPS Take 200 capsules by mouth daily.     sertraline  (ZOLOFT ) 100 MG tablet TAKE 1 TABLET BY MOUTH EVERY DAY FOR DEPRESSION 90 tablet 2   No current facility-administered medications on file prior to visit.    BP 126/66   Pulse 72   Temp (!) 97.5 F (36.4 C) (Temporal)   Ht 5' 1 (1.549 m)   Wt 164 lb 9.6 oz (74.7 kg)   LMP 09/30/2009   SpO2  97%   BMI 31.10 kg/m  Objective:   Physical Exam Constitutional:      General: She is not in acute distress. Chest:  Breasts:    Right: Mass and skin change present. No tenderness.     Left: No mass, skin change or tenderness.       Comments: 0.5 cm rounded, slightly raised, firm, nontender, immobile mass to edge of right medial breast.  Partially touching chest wall. Mild erythema to site.  Skin:    General: Skin is warm and dry.     Findings: Erythema present.           Assessment & Plan:  Mass of right breast, unspecified quadrant Assessment & Plan: Interesting presentation today.  The spot of concern is partially on the breast and partially on her chest wall. Given family history, coupled with location, will obtain diagnostic mammogram and bilateral breast ultrasounds. Orders placed.  Orders: -     MM 3D DIAGNOSTIC MAMMOGRAM BILATERAL BREAST; Future -     US  BREAST COMPLETE UNI LEFT INC AXILLA; Future -     US  BREAST COMPLETE UNI RIGHT INC AXILLA; Future        Anisa Leanos K Cambren Helm, NP

## 2024-05-12 NOTE — Patient Instructions (Signed)
 You will receive a phone call regarding your breast ultrasound and mammogram.  It was a pleasure to see you today!

## 2024-05-14 ENCOUNTER — Ambulatory Visit: Payer: Self-pay | Admitting: Primary Care

## 2024-05-14 ENCOUNTER — Ambulatory Visit
Admission: RE | Admit: 2024-05-14 | Discharge: 2024-05-14 | Disposition: A | Discharge status: Home / Self Care | Source: Ambulatory Visit | Attending: Primary Care | Admitting: Primary Care

## 2024-05-14 DIAGNOSIS — N6310 Unspecified lump in the right breast, unspecified quadrant: Secondary | ICD-10-CM | POA: Insufficient documentation

## 2024-05-14 DIAGNOSIS — R92333 Mammographic heterogeneous density, bilateral breasts: Secondary | ICD-10-CM | POA: Diagnosis not present

## 2024-05-14 DIAGNOSIS — L720 Epidermal cyst: Secondary | ICD-10-CM | POA: Diagnosis not present

## 2024-05-14 DIAGNOSIS — N6315 Unspecified lump in the right breast, overlapping quadrants: Secondary | ICD-10-CM | POA: Diagnosis not present

## 2024-06-03 ENCOUNTER — Other Ambulatory Visit: Payer: Self-pay | Admitting: Primary Care

## 2024-06-03 DIAGNOSIS — F3342 Major depressive disorder, recurrent, in full remission: Secondary | ICD-10-CM

## 2024-06-03 NOTE — Telephone Encounter (Signed)
Patient is due for CPE/follow up in mid October, this will be required prior to any further refills.  Please schedule, thank you!

## 2024-06-03 NOTE — Telephone Encounter (Signed)
 I called patient and she verbalized she would give a call back to get scheduled due to not having her calendar with her right now.

## 2024-06-14 NOTE — Telephone Encounter (Signed)
 Called pt mail box is ful

## 2024-08-16 ENCOUNTER — Telehealth: Admitting: Physician Assistant

## 2024-08-16 DIAGNOSIS — L304 Erythema intertrigo: Secondary | ICD-10-CM | POA: Diagnosis not present

## 2024-08-17 NOTE — Progress Notes (Unsigned)
 Message sent to patient requesting further input regarding current symptoms. Awaiting patient response.

## 2024-08-18 MED ORDER — NYSTATIN 100000 UNIT/GM EX CREA: 1.0000 | TOPICAL_CREAM | Freq: Two times a day (BID) | CUTANEOUS | 0 refills | Status: AC

## 2024-08-18 NOTE — Progress Notes (Signed)
 E Visit for Rash  We are sorry that you are not feeling well. Here is how we plan to help!  Based upon your presentation it appears you have a fungal infection.  I have prescribed: and Nystatin cream apply to the affected area twice daily   HOME CARE:  Take cool showers and avoid direct sunlight. Apply cool compress or wet dressings. Take a bath in an oatmeal bath.  Sprinkle content of one Aveeno packet under running faucet with comfortably warm water.  Bathe for 15-20 minutes, 1-2 times daily.  Pat dry with a towel. Do not rub the rash. Use hydrocortisone cream. Take an antihistamine like Benadryl for widespread rashes that itch.  The adult dose of Benadryl is 25-50 mg by mouth 4 times daily. Caution:  This type of medication may cause sleepiness.  Do not drink alcohol, drive, or operate dangerous machinery while taking antihistamines.  Do not take these medications if you have prostate enlargement.  Read package instructions thoroughly on all medications that you take.  GET HELP RIGHT AWAY IF:  Symptoms don't go away after treatment. Severe itching that persists. If you rash spreads or swells. If you rash begins to smell. If it blisters and opens or develops a yellow-brown crust. You develop a fever. You have a sore throat. You become short of breath.  MAKE SURE YOU:  Understand these instructions. Will watch your condition. Will get help right away if you are not doing well or get worse.  Thank you for choosing an e-visit. Your e-visit answers were reviewed by a board certified advanced clinical practitioner to complete your personal care plan. Depending upon the condition, your plan could have included both over the counter or prescription medications. Please review your pharmacy choice. Be sure that the pharmacy you have chosen is open so that you can pick up your prescription now.  If there is a problem you may message your provider in MyChart to have the prescription routed  to another pharmacy. Your safety is important to us . If you have drug allergies check your prescription carefully.  For the next 24 hours, you can use MyChart to ask questions about today's visit, request a non-urgent call back, or ask for a work or school excuse from your e-visit provider. You will get an email in the next two days asking about your experience. I hope that your e-visit has been valuable and will speed your recovery.  I have spent 5 minutes in review of e-visit questionnaire, review and updating patient chart, medical decision making and response to patient.   Delon CHRISTELLA Dickinson, PA-C

## 2024-09-02 ENCOUNTER — Other Ambulatory Visit: Payer: Self-pay | Admitting: Primary Care

## 2024-09-02 DIAGNOSIS — F3342 Major depressive disorder, recurrent, in full remission: Secondary | ICD-10-CM

## 2024-09-02 NOTE — Telephone Encounter (Signed)
Patient is overdue for CPE/follow up, this will be required prior to any further refills.  Please schedule, thank you!   

## 2024-09-02 NOTE — Telephone Encounter (Signed)
 I called and left a voicemail for pt to be scheduled.

## 2024-09-29 ENCOUNTER — Other Ambulatory Visit: Payer: Self-pay | Admitting: Primary Care

## 2024-09-29 DIAGNOSIS — F3342 Major depressive disorder, recurrent, in full remission: Secondary | ICD-10-CM

## 2024-10-05 ENCOUNTER — Encounter: Admitting: Primary Care
